# Patient Record
Sex: Female | Born: 1985 | Race: Black or African American | Hispanic: No | Marital: Married | Smoking: Never smoker
Health system: Southern US, Community
[De-identification: ages and names within clinical notes are randomized; demographics above are authoritative.]

## PROBLEM LIST (undated history)

## (undated) DIAGNOSIS — K219 Gastro-esophageal reflux disease without esophagitis: Secondary | ICD-10-CM

## (undated) DIAGNOSIS — F32A Depression, unspecified: Secondary | ICD-10-CM

## (undated) DIAGNOSIS — F419 Anxiety disorder, unspecified: Secondary | ICD-10-CM

## (undated) HISTORY — PX: NO PAST SURGERIES: SHX2092

## (undated) HISTORY — DX: Gastro-esophageal reflux disease without esophagitis: K21.9

## (undated) HISTORY — DX: Depression, unspecified: F32.A

## (undated) HISTORY — DX: Anxiety disorder, unspecified: F41.9

---
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
## 2003-07-26 ENCOUNTER — Other Ambulatory Visit: Admission: RE | Admit: 2003-07-26 | Discharge: 2003-07-26 | Payer: Self-pay | Admitting: Obstetrics and Gynecology

## 2005-02-19 ENCOUNTER — Other Ambulatory Visit: Admission: RE | Admit: 2005-02-19 | Discharge: 2005-02-19 | Payer: Self-pay | Admitting: Obstetrics and Gynecology

## 2005-10-13 ENCOUNTER — Emergency Department (HOSPITAL_COMMUNITY): Admission: EM | Admit: 2005-10-13 | Discharge: 2005-10-13 | Payer: Self-pay | Admitting: *Deleted

## 2005-12-31 ENCOUNTER — Emergency Department (HOSPITAL_COMMUNITY): Admission: EM | Admit: 2005-12-31 | Discharge: 2005-12-31 | Payer: Self-pay | Admitting: Family Medicine

## 2006-02-01 ENCOUNTER — Emergency Department (HOSPITAL_COMMUNITY): Admission: EM | Admit: 2006-02-01 | Discharge: 2006-02-01 | Payer: Self-pay | Admitting: Family Medicine

## 2007-04-01 ENCOUNTER — Emergency Department (HOSPITAL_COMMUNITY): Admission: EM | Admit: 2007-04-01 | Discharge: 2007-04-01 | Payer: Self-pay | Admitting: Family Medicine

## 2007-04-22 ENCOUNTER — Emergency Department (HOSPITAL_COMMUNITY): Admission: EM | Admit: 2007-04-22 | Discharge: 2007-04-22 | Payer: Self-pay | Admitting: Emergency Medicine

## 2008-01-26 ENCOUNTER — Emergency Department (HOSPITAL_COMMUNITY): Admission: EM | Admit: 2008-01-26 | Discharge: 2008-01-26 | Payer: Self-pay | Admitting: Emergency Medicine

## 2009-04-21 ENCOUNTER — Ambulatory Visit (HOSPITAL_COMMUNITY): Admission: RE | Admit: 2009-04-21 | Discharge: 2009-04-21 | Payer: Self-pay | Admitting: Family Medicine

## 2009-06-08 ENCOUNTER — Emergency Department (HOSPITAL_COMMUNITY): Admission: EM | Admit: 2009-06-08 | Discharge: 2009-06-08 | Payer: Self-pay | Admitting: Emergency Medicine

## 2009-06-21 ENCOUNTER — Emergency Department (HOSPITAL_COMMUNITY): Admission: EM | Admit: 2009-06-21 | Discharge: 2009-06-21 | Payer: Self-pay | Admitting: Emergency Medicine

## 2010-10-11 IMAGING — CT CT HEAD W/O CM
1 series · 16 of 30 positions shown, 20 images · non-contrast
Comparison: None

CLINICAL DATA: Headache

CT HEAD WITHOUT CONTRAST
TECHNIQUE: Contiguous axial images were obtained from the base of
the skull through the vertex without contrast.

[Series 2: brain · axial · 0.47mm/px · z∈[+89,+224]mm · 16 of 30 slices shown, 20 images]
[im 2/30  brain]
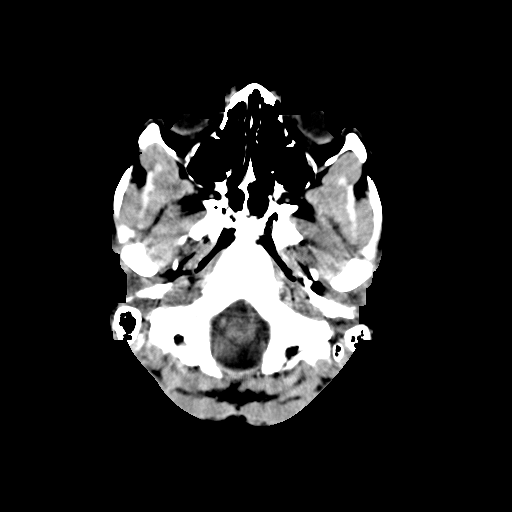
[im 2/30  bone]
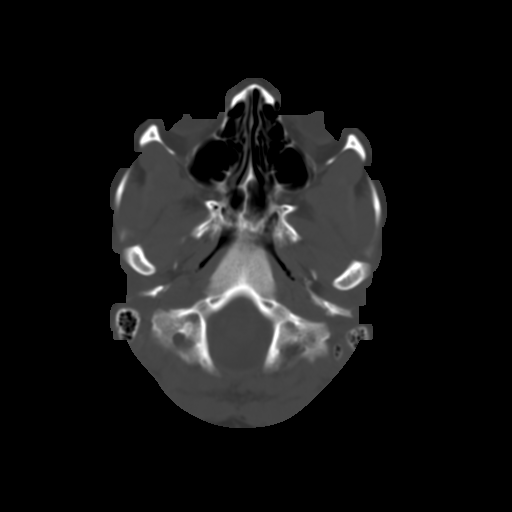
[im 4/30  brain]
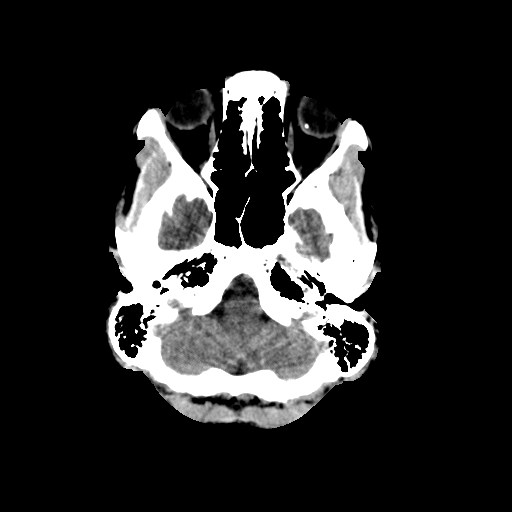
[im 6/30  brain]
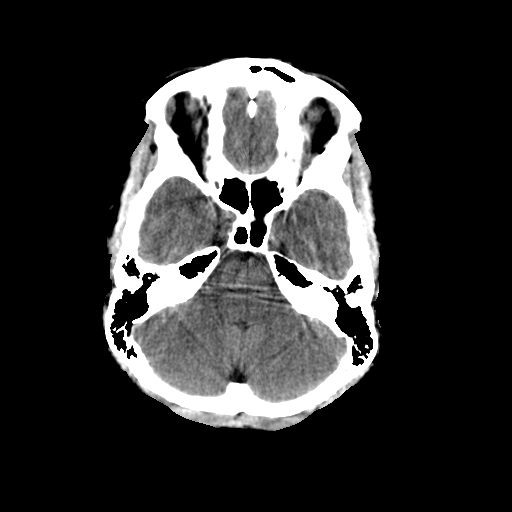
[im 8/30  brain]
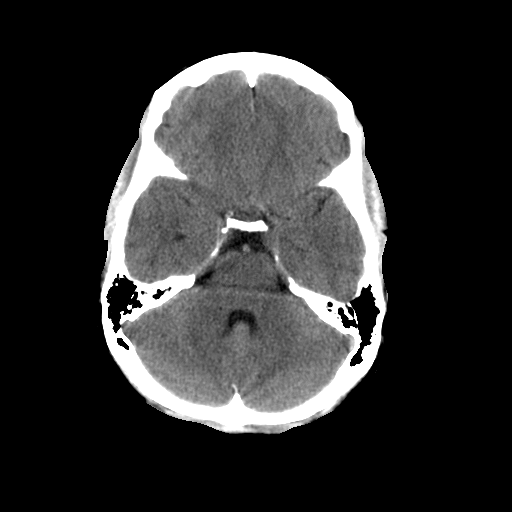
[im 9/30  brain]
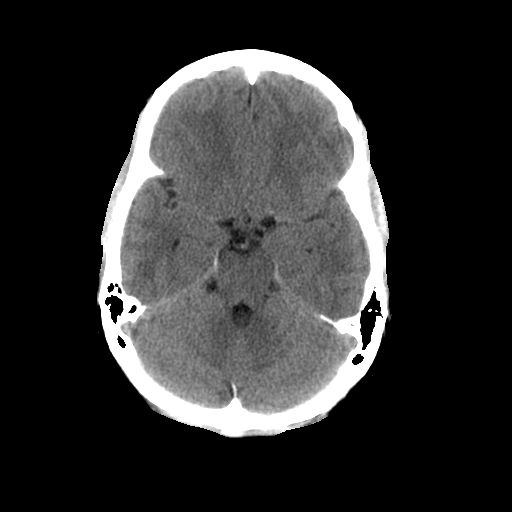
[im 9/30  bone]
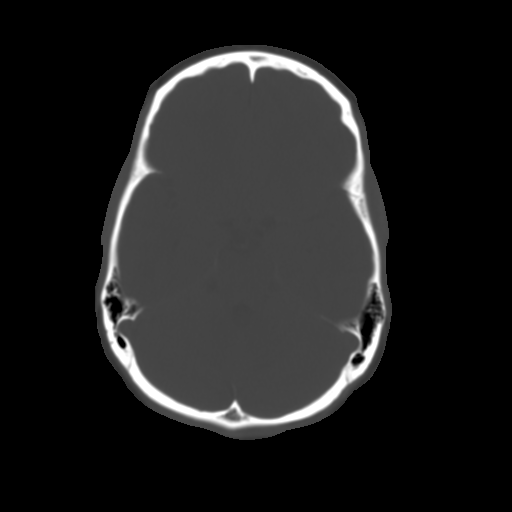
[im 11/30  brain]
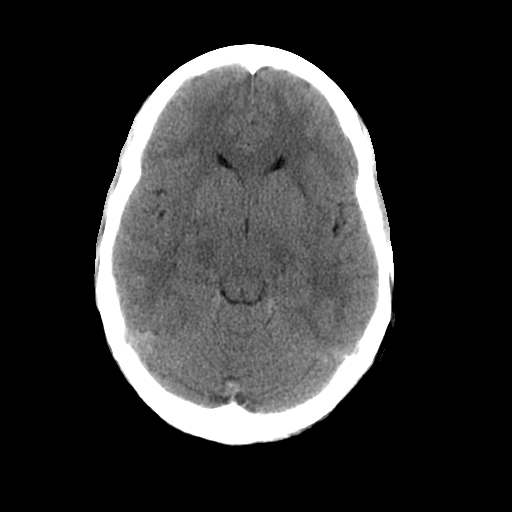
[im 13/30  brain]
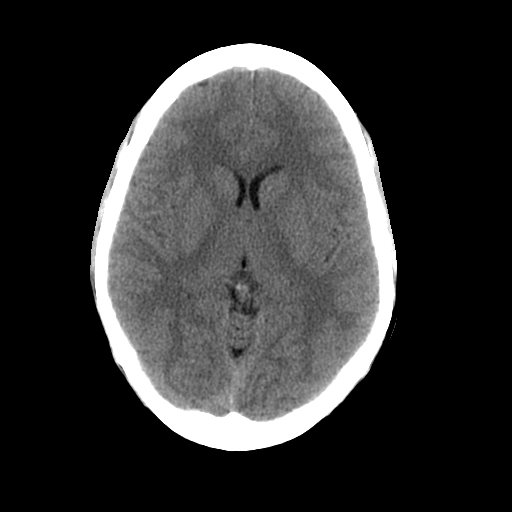
[im 15/30  brain]
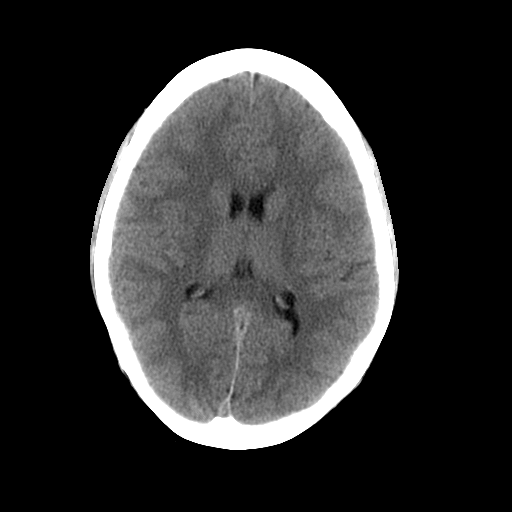
[im 16/30  brain]
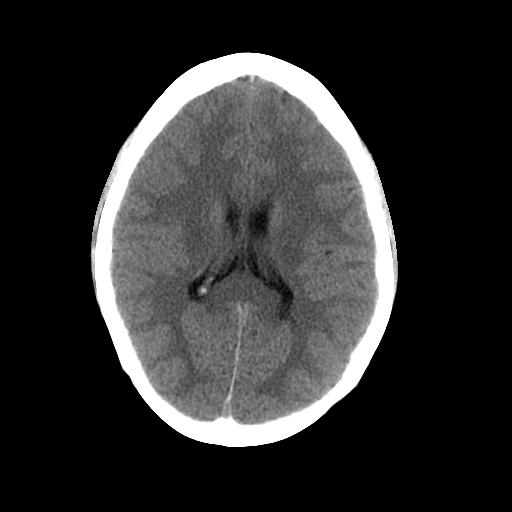
[im 16/30  bone]
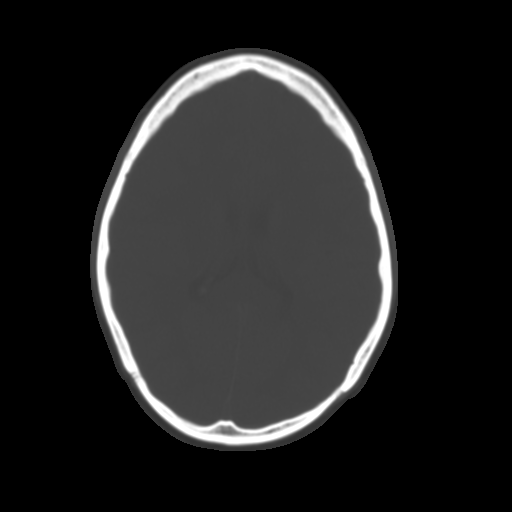
[im 18/30  brain]
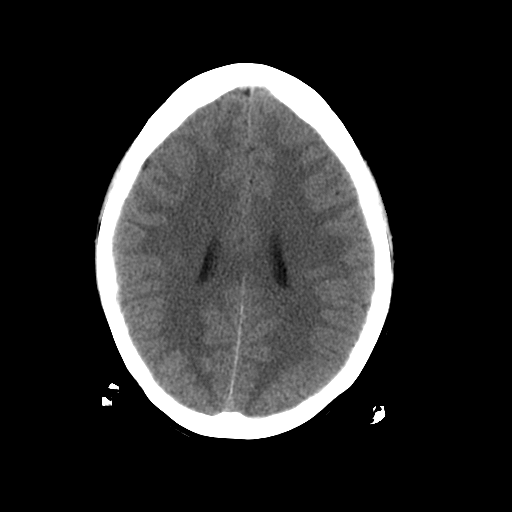
[im 20/30  brain]
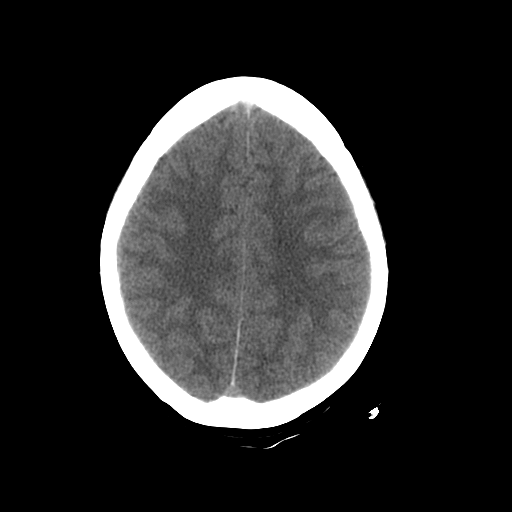
[im 22/30  brain]
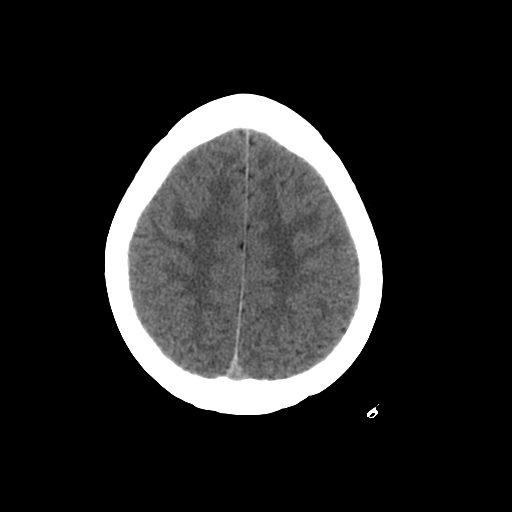
[im 23/30  brain]
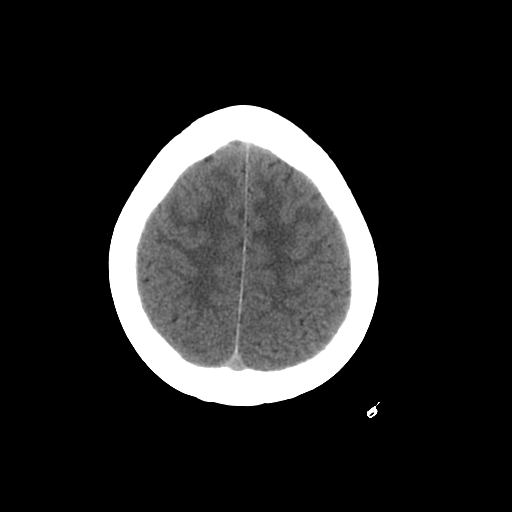
[im 23/30  bone]
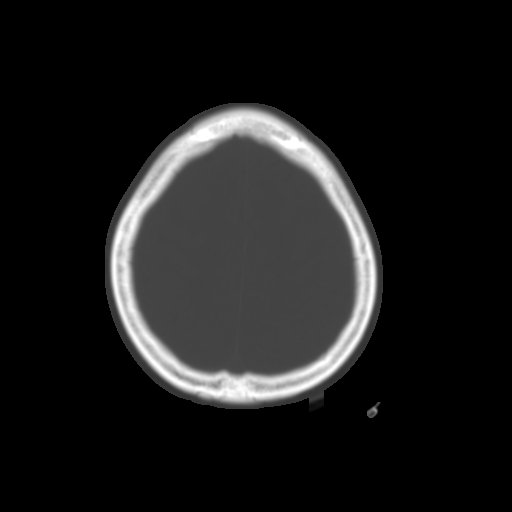
[im 25/30  brain]
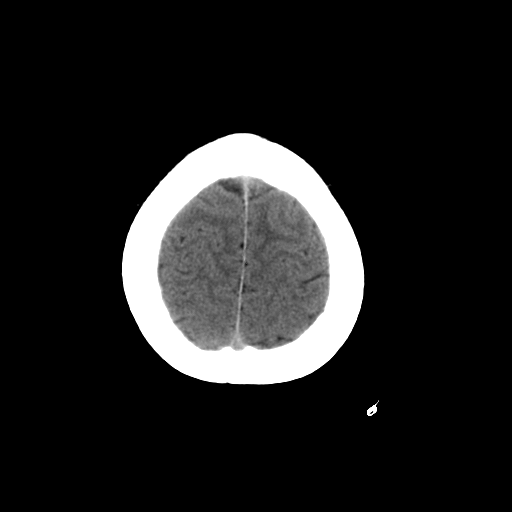
[im 27/30  brain]
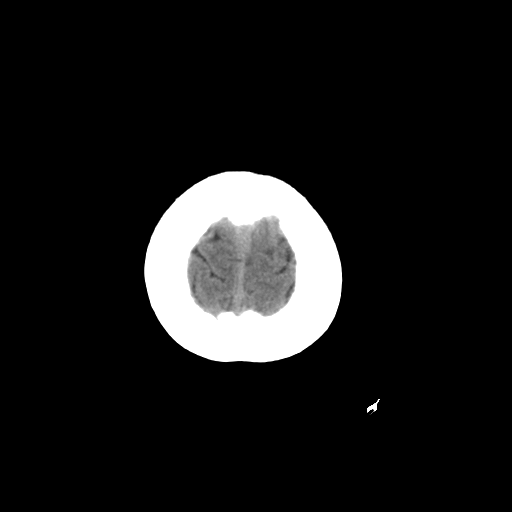
[im 29/30  brain]
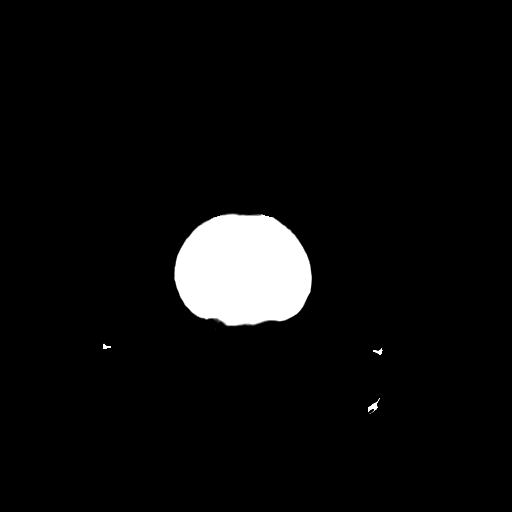

[16 of 30 positions shown; findings below may reference images not displayed]

FINDINGS: No mass effect, midline shift, or acute intracranial
hemorrhage.  Ventricles system is unremarkable.  Mastoid air cells
are clear.  Cranium is intact.
IMPRESSION: No acute intracranial pathology.

## 2010-12-11 IMAGING — CR DG CHEST 2V
2 series · 2 of 2 positions shown · non-contrast
Comparison: 01/26/2008

CLINICAL DATA: Chest pain radiating to back.

CHEST - 2 VIEW

[w chest pa]
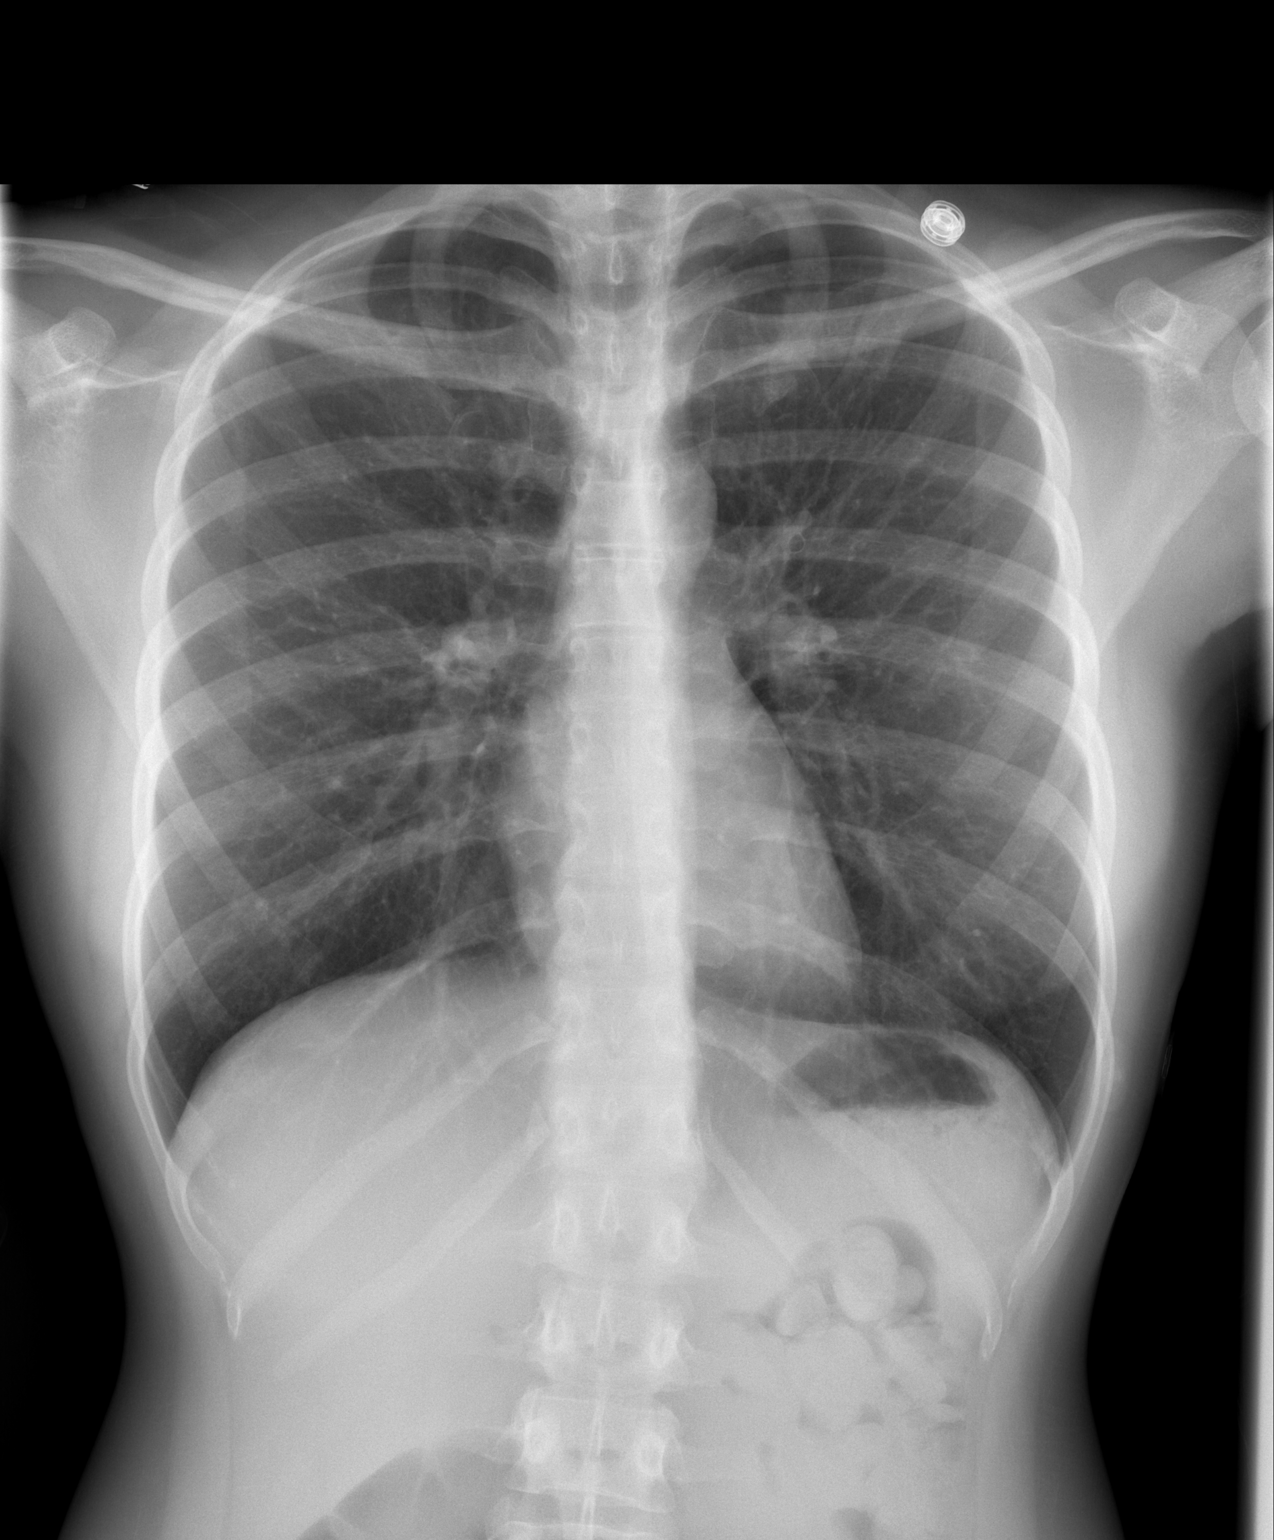

[w chest lat]
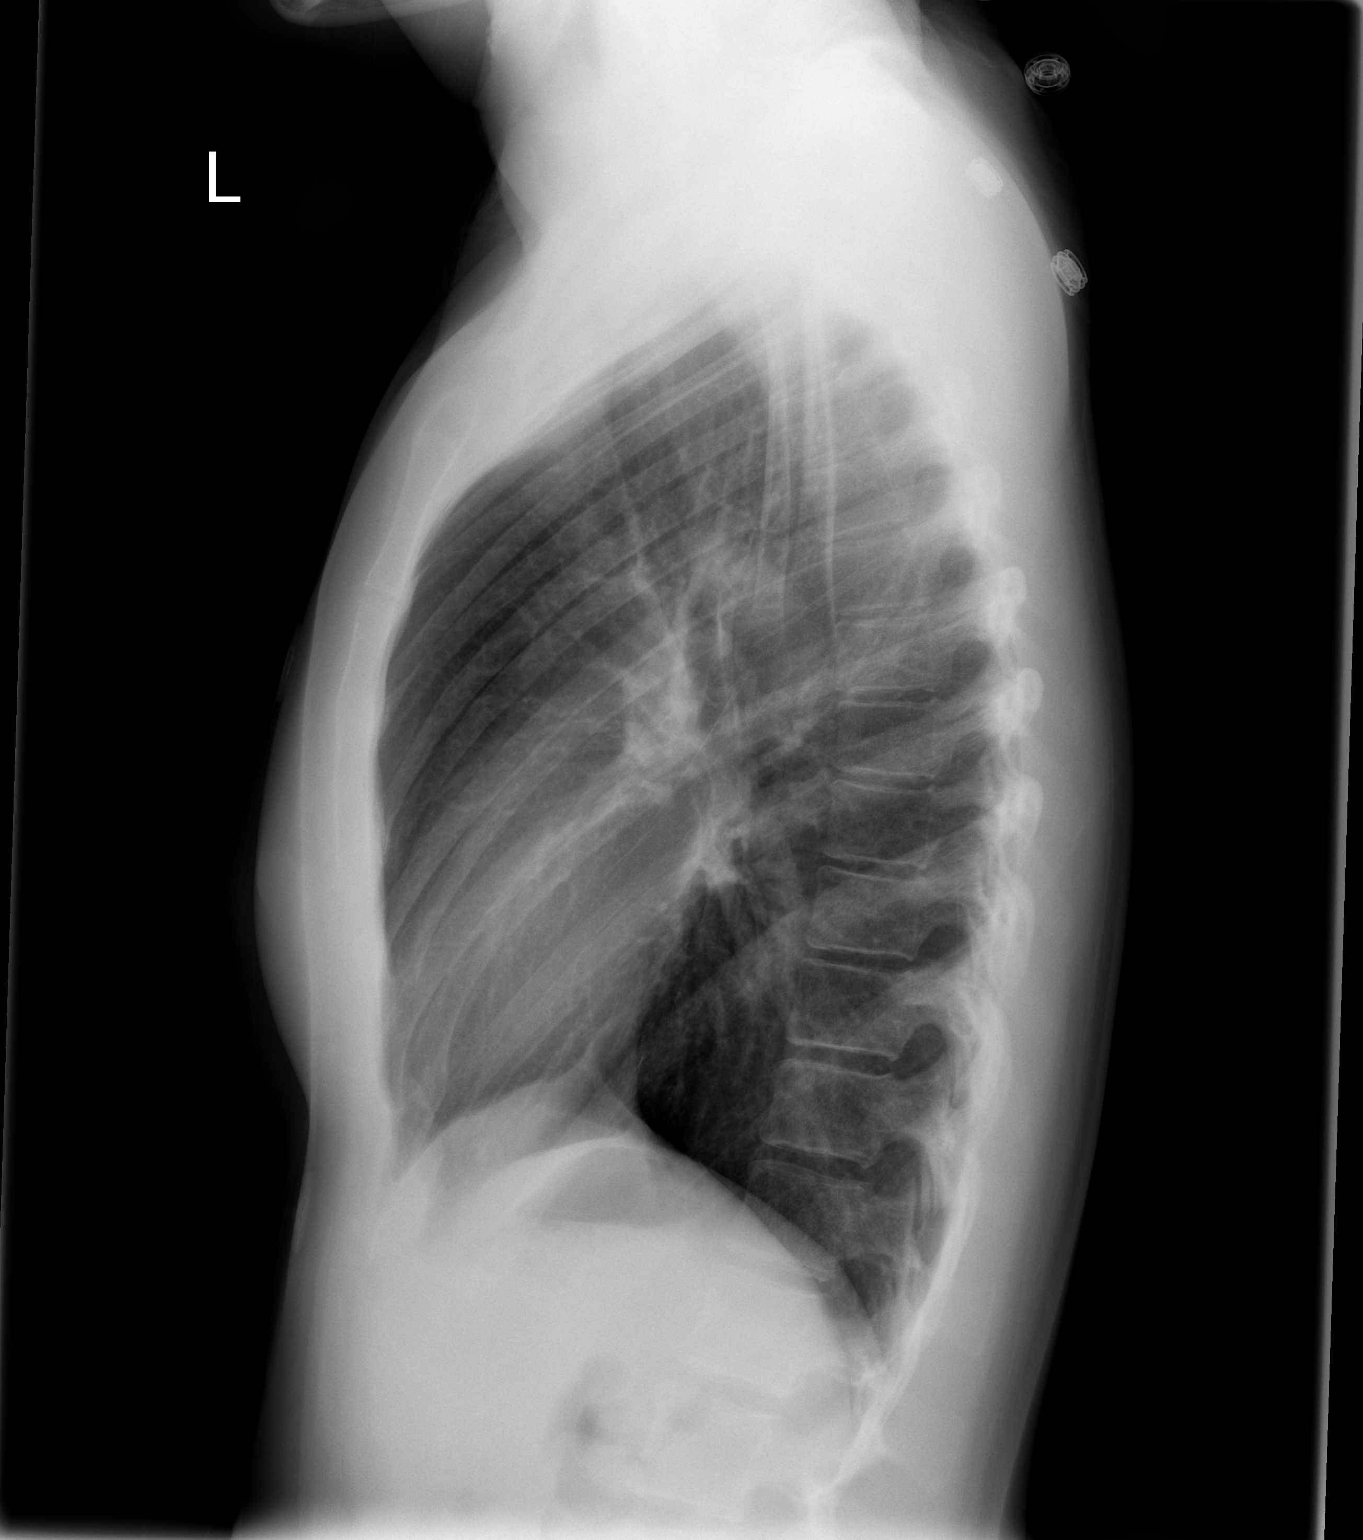

[2 of 2 positions shown; findings below may reference images not displayed]

FINDINGS: The heart size and mediastinal contours are within
normal limits.  Both lungs are clear.  The visualized skeletal
structures are unremarkable.
IMPRESSION: No active cardiopulmonary disease.

## 2011-03-15 LAB — POCT I-STAT, CHEM 8
BUN: 12 mg/dL (ref 6–23)
Chloride: 104 mEq/L (ref 96–112)
Creatinine, Ser: 0.9 mg/dL (ref 0.4–1.2)
Glucose, Bld: 99 mg/dL (ref 70–99)
Potassium: 4.1 mEq/L (ref 3.5–5.1)

## 2011-10-21 ENCOUNTER — Ambulatory Visit (INDEPENDENT_AMBULATORY_CARE_PROVIDER_SITE_OTHER): Payer: Self-pay | Admitting: Internal Medicine

## 2011-10-21 ENCOUNTER — Encounter: Payer: Self-pay | Admitting: Internal Medicine

## 2011-10-21 VITALS — BP 106/64 | HR 77 | Temp 97.9°F | Resp 16 | Wt 131.0 lb

## 2011-10-21 DIAGNOSIS — S139XXA Sprain of joints and ligaments of unspecified parts of neck, initial encounter: Secondary | ICD-10-CM

## 2011-10-21 DIAGNOSIS — Z23 Encounter for immunization: Secondary | ICD-10-CM

## 2011-10-21 DIAGNOSIS — S161XXA Strain of muscle, fascia and tendon at neck level, initial encounter: Secondary | ICD-10-CM

## 2011-10-21 NOTE — Patient Instructions (Signed)
Cervical Strain Care After A cervical strain is when the muscles and ligaments in your neck have been stretched. The bones are not broken. If you had any problems moving your arms or legs immediately after the injury, even if the problem has gone away, make sure to tell this to your caregiver.  HOME CARE INSTRUCTIONS   While awake, apply ice packs to the neck or areas of pain about every 1 to 2 hours, for 15 to 20 minutes at a time. Do this for 2 days. If you were given a cervical collar for support, ask your caregiver if you may remove it for bathing or applying ice.   If given a cervical collar, wear as instructed. Do not remove any collar unless instructed by a caregiver.   Only take over-the-counter or prescription medicines for pain, discomfort, or fever as directed by your caregiver.  Recheck with the hospital or clinic after a radiologist has read your X-rays. Recheck with the hospital or clinic to make sure the initial readings are correct. Do this also to determine if you need further studies. It is your responsibility to find out your X-ray results. X-rays are sometimes repeated in one week to ten days. These are often repeated to make sure that a hairline fracture was not overlooked. Ask your caregiver how you are to find out about your radiology (X-ray) results. SEEK IMMEDIATE MEDICAL CARE IF:   You have increasing pain in your neck.   You develop difficulties swallowing or breathing.   You have numbness, weakness, or movement problems in the arms or legs.   You have difficulty walking.   You develop bowel or bladder retention or incontinence.   You have problems with walking.  MAKE SURE YOU:   Understand these instructions.   Will watch your condition.   Will get help right away if you are not doing well or get worse.  Document Released: 11/23/2005 Document Revised: 08/05/2011 Document Reviewed: 07/06/2008 Exit[employer] Patient Information 2012 [employer]. 

## 2011-10-22 NOTE — Assessment & Plan Note (Signed)
She does not have any radicular symptoms and has a normal exam. Also, there is not hx. Of trauma and no midline s/s so I do not think an xray is needed. She will continue with aleve and I have asked her to start PT to help with her nocturnal positioning and exercise modalities.

## 2011-10-22 NOTE — Progress Notes (Signed)
Subjective:    Patient ID: Monique Pierce, female    DOB: 02-18-1986, 25 y.o.   MRN: [id number]  HPI New to me she complains of stiffness in her neck for 3 months. She has not had an injury but she tells me that she sleeps in awkward positions with her neck in the extremes of flexion or extension. When she awakens in the morning she will have stiffness in both traps and she will take aleve and the pain will resolve. The pain does not radiate into her shoulders or arms and she does not have any numbness, weakness, or tingling in her arms or legs.   Review of Systems  Constitutional: Negative.   HENT: Positive for neck pain and neck stiffness. Negative for hearing loss, nosebleeds, sore throat, drooling, trouble swallowing, voice change, sinus pressure and ear discharge.   Eyes: Negative.   Respiratory: Negative.   Cardiovascular: Negative.   Gastrointestinal: Negative.   Genitourinary: Negative.   Musculoskeletal: Negative for myalgias, back pain, joint swelling, arthralgias and gait problem.  Skin: Negative for color change, pallor, rash and wound.  Neurological: Negative for dizziness, tremors, seizures, syncope, facial asymmetry, speech difficulty, weakness, light-headedness, numbness and headaches.  Hematological: Negative for adenopathy. Does not bruise/bleed easily.  Psychiatric/Behavioral: Negative.        Objective:   Physical Exam  Vitals reviewed. Constitutional: She is oriented to person, place, and time. She appears well-developed and well-nourished. No distress.  HENT:  Head: Normocephalic and atraumatic.  Mouth/Throat: Oropharynx is clear and moist. No oropharyngeal exudate.  Eyes: Conjunctivae are normal. Right eye exhibits no discharge. Left eye exhibits no discharge. No scleral icterus.  Neck: Normal range of motion. Neck supple. No JVD present. No tracheal deviation present. No thyromegaly present.  Cardiovascular: Normal rate, regular rhythm, normal heart sounds and  intact distal pulses.  Exam reveals no gallop and no friction rub.   No murmur heard. Pulmonary/Chest: Effort normal and breath sounds normal. No stridor. No respiratory distress. She has no wheezes. She has no rales. She exhibits no tenderness.  Abdominal: Soft. Bowel sounds are normal. She exhibits no distension and no mass. There is no tenderness. There is no rebound and no guarding.  Musculoskeletal: Normal range of motion. She exhibits no edema and no tenderness.       Cervical back: Normal. She exhibits normal range of motion, no tenderness, no bony tenderness, no swelling, no edema, no deformity, no laceration, no pain, no spasm and normal pulse.  Lymphadenopathy:    She has no cervical adenopathy.  Neurological: She is alert and oriented to person, place, and time. She has normal strength. She displays no atrophy, no tremor and normal reflexes. No cranial nerve deficit or sensory deficit. She exhibits normal muscle tone. She displays a negative Romberg sign. She displays no seizure activity. Coordination and gait normal. She displays no Babinski's sign on the right side. She displays no Babinski's sign on the left side.  Reflex Scores:      Tricep reflexes are 1+ on the right side and 1+ on the left side.      Bicep reflexes are 1+ on the right side and 1+ on the left side.      Brachioradialis reflexes are 1+ on the right side and 1+ on the left side.      Patellar reflexes are 1+ on the right side and 1+ on the left side.      Achilles reflexes are 1+ on the right side and  1+ on the left side. Skin: Skin is warm and dry. No rash noted. She is not diaphoretic. No erythema. No pallor.  Psychiatric: She has a normal mood and affect. Her behavior is normal. Judgment and thought content normal.          Assessment & Plan:

## 2011-10-26 ENCOUNTER — Telehealth: Payer: Self-pay

## 2011-10-26 DIAGNOSIS — S161XXA Strain of muscle, fascia and tendon at neck level, initial encounter: Secondary | ICD-10-CM

## 2011-10-26 MED ORDER — METHOCARBAMOL 500 MG PO TABS: 500.0000 mg | ORAL_TABLET | Freq: Four times a day (QID) | ORAL | Status: AC

## 2011-10-26 MED ORDER — METHOCARBAMOL 500 MG PO TABS: 500.0000 mg | ORAL_TABLET | Freq: Four times a day (QID) | ORAL | Status: DC

## 2011-10-26 NOTE — Telephone Encounter (Signed)
Addended by: Rock Nephew T on: 10/26/2011 03:20 PM   Modules accepted: Orders

## 2011-10-26 NOTE — Telephone Encounter (Signed)
Pharmacy given by patient and rx sent in

## 2011-10-26 NOTE — Telephone Encounter (Signed)
Pharmacy is needed 

## 2011-10-26 NOTE — Telephone Encounter (Signed)
Returned call to patient/[facility] requesting pharmacy

## 2011-10-26 NOTE — Telephone Encounter (Signed)
Patient called [facility] stating that she was seen last week by MD and PT was ordered for her. She would like to know if MD would send in rx for a muscle relaxer to help with her increased neck pain until she is seen. Please advise Thanks

## 2011-11-11 ENCOUNTER — Ambulatory Visit: Payer: BC Managed Care – PPO | Attending: Internal Medicine | Admitting: Rehabilitation

## 2011-11-11 DIAGNOSIS — R293 Abnormal posture: Secondary | ICD-10-CM | POA: Insufficient documentation

## 2011-11-11 DIAGNOSIS — M542 Cervicalgia: Secondary | ICD-10-CM | POA: Insufficient documentation

## 2011-11-11 DIAGNOSIS — IMO0001 Reserved for inherently not codable concepts without codable children: Secondary | ICD-10-CM | POA: Insufficient documentation

## 2011-11-23 ENCOUNTER — Ambulatory Visit: Payer: BC Managed Care – PPO | Admitting: Physical Therapy

## 2011-11-25 ENCOUNTER — Encounter: Payer: BC Managed Care – PPO | Admitting: Physical Therapy

## 2011-11-25 ENCOUNTER — Ambulatory Visit: Payer: BC Managed Care – PPO | Admitting: Rehabilitation

## 2011-12-10 ENCOUNTER — Encounter: Payer: BC Managed Care – PPO | Admitting: Rehabilitation

## 2011-12-14 ENCOUNTER — Encounter: Payer: BC Managed Care – PPO | Admitting: Physical Therapy

## 2011-12-17 ENCOUNTER — Encounter: Payer: BC Managed Care – PPO | Admitting: Physical Therapy

## 2011-12-21 ENCOUNTER — Encounter: Payer: BC Managed Care – PPO | Admitting: Physical Therapy

## 2011-12-24 ENCOUNTER — Encounter: Payer: BC Managed Care – PPO | Admitting: Physical Therapy

## 2012-04-28 ENCOUNTER — Telehealth: Payer: Self-pay | Admitting: Internal Medicine

## 2012-04-28 NOTE — Telephone Encounter (Signed)
I contacted pt to sched an appt per lakisha and pt says:  " That's ok , I am not coming in---

## 2012-04-28 NOTE — Telephone Encounter (Signed)
Pt requesting prescription/voltaren gel sent to [facility] [address]--pt desk ph# [phone]

## 2012-04-28 NOTE — Telephone Encounter (Signed)
i think this is the wrong chart

## 2012-04-28 NOTE — Telephone Encounter (Signed)
Monique Pierce 04/28/2012 10:58 AM Signed  I contacted pt to sched an appt per Alvy Beal and pt says: " That's ok , I am not coming in---

## 2012-04-28 NOTE — Telephone Encounter (Signed)
The pt called and is hoping to get a refill of zoltaren [facility] to [facility] [address].   [phone]

## 2012-07-01 ENCOUNTER — Ambulatory Visit (INDEPENDENT_AMBULATORY_CARE_PROVIDER_SITE_OTHER): Payer: BC Managed Care – PPO | Admitting: Internal Medicine

## 2012-07-01 ENCOUNTER — Encounter: Payer: Self-pay | Admitting: Internal Medicine

## 2012-07-01 VITALS — BP 106/62 | HR 67 | Temp 98.4°F | Resp 98 | Wt 132.0 lb

## 2012-07-01 DIAGNOSIS — R1310 Dysphagia, unspecified: Secondary | ICD-10-CM

## 2012-07-01 DIAGNOSIS — K219 Gastro-esophageal reflux disease without esophagitis: Secondary | ICD-10-CM

## 2012-07-01 MED ORDER — RANITIDINE HCL 300 MG PO TABS: 300.0000 mg | ORAL_TABLET | Freq: Every day | ORAL | Status: DC

## 2012-07-01 NOTE — Assessment & Plan Note (Signed)
She has been taking otc zantac 75 mg per day with some relief, I increased that to 300 mg per day

## 2012-07-01 NOTE — Assessment & Plan Note (Signed)
GI referral to consider UGI endoscopy

## 2012-07-01 NOTE — Progress Notes (Signed)
  Subjective:    Patient ID: Monique Pierce, female    DOB: 1986-01-25, 26 y.o.   MRN: [id number]  Gastrophageal Reflux She complains of dysphagia (it hurts to swallow food) and heartburn. She reports no abdominal pain, no belching, no chest pain, no choking, no coughing, no early satiety, no globus sensation, no hoarse voice, no nausea, no sore throat, no stridor, no tooth decay, no water brash or no wheezing. This is a chronic problem. The current episode started more than 1 year ago. The problem occurs frequently. The problem has been gradually worsening. The heartburn duration is several minutes. The heartburn is located in the substernum. The heartburn is of mild intensity. The heartburn wakes her from sleep. The heartburn does not limit her activity. The heartburn doesn't change with position. Nothing aggravates the symptoms. Pertinent negatives include no anemia, fatigue, melena, muscle weakness, orthopnea or weight loss. Risk factors include no known risk factors. She has tried a histamine-2 antagonist for the symptoms. The treatment provided moderate relief.      Review of Systems  Constitutional: Negative.  Negative for weight loss and fatigue.  HENT: Negative.  Negative for sore throat and hoarse voice.   Eyes: Negative.   Respiratory: Negative.  Negative for cough, choking, chest tightness, shortness of breath, wheezing and stridor.   Cardiovascular: Negative.  Negative for chest pain.  Gastrointestinal: Positive for heartburn and dysphagia (it hurts to swallow food). Negative for nausea, vomiting, abdominal pain, diarrhea, constipation, blood in stool, melena, abdominal distention, anal bleeding and rectal pain.  Genitourinary: Negative.   Musculoskeletal: Negative.  Negative for muscle weakness.  Skin: Negative.   Neurological: Negative.   Hematological: Negative.   Psychiatric/Behavioral: Negative.        Objective:   Physical Exam  Vitals reviewed. Constitutional: She is  oriented to person, place, and time. She appears well-developed and well-nourished. No distress.  HENT:  Head: Normocephalic and atraumatic.  Mouth/Throat: Oropharynx is clear and moist. No oropharyngeal exudate.  Eyes: Conjunctivae are normal. Right eye exhibits no discharge. Left eye exhibits no discharge. No scleral icterus.  Neck: Normal range of motion. Neck supple. No JVD present. No tracheal deviation present. No thyromegaly present.  Cardiovascular: Normal rate, regular rhythm, normal heart sounds and intact distal pulses.  Exam reveals no gallop and no friction rub.   No murmur heard. Pulmonary/Chest: Effort normal and breath sounds normal. No stridor. No respiratory distress. She has no wheezes. She has no rales. She exhibits no tenderness.  Abdominal: Soft. Bowel sounds are normal. She exhibits no distension and no mass. There is no tenderness. There is no rebound and no guarding.  Musculoskeletal: Normal range of motion. She exhibits no edema and no tenderness.  Lymphadenopathy:    She has no cervical adenopathy.  Neurological: She is oriented to person, place, and time.  Skin: Skin is warm and dry. No rash noted. She is not diaphoretic. No erythema. No pallor.  Psychiatric: She has a normal mood and affect. Her behavior is normal. Judgment and thought content normal.      Lab Results  Component Value Date   HGB 14.3 06/21/2009   HCT 42.0 06/21/2009   GLUCOSE 99 06/21/2009   NA 140 06/21/2009   K 4.1 06/21/2009   CL 104 06/21/2009   CREATININE 0.9 06/21/2009   BUN 12 06/21/2009      Assessment & Plan:

## 2012-07-01 NOTE — Patient Instructions (Signed)

## 2012-08-31 ENCOUNTER — Ambulatory Visit: Payer: BC Managed Care – PPO | Admitting: Internal Medicine

## 2012-09-12 ENCOUNTER — Encounter: Payer: Self-pay | Admitting: Internal Medicine

## 2012-09-21 ENCOUNTER — Encounter: Payer: Self-pay | Admitting: Internal Medicine

## 2012-09-30 ENCOUNTER — Encounter: Payer: Self-pay | Admitting: Internal Medicine

## 2012-10-03 ENCOUNTER — Ambulatory Visit (INDEPENDENT_AMBULATORY_CARE_PROVIDER_SITE_OTHER): Payer: BC Managed Care – PPO | Admitting: Internal Medicine

## 2012-10-03 ENCOUNTER — Encounter: Payer: Self-pay | Admitting: Internal Medicine

## 2012-10-03 VITALS — BP 100/60 | HR 60 | Ht 65.0 in | Wt 132.0 lb

## 2012-10-03 DIAGNOSIS — R1314 Dysphagia, pharyngoesophageal phase: Secondary | ICD-10-CM

## 2012-10-03 DIAGNOSIS — R1310 Dysphagia, unspecified: Secondary | ICD-10-CM

## 2012-10-03 DIAGNOSIS — K219 Gastro-esophageal reflux disease without esophagitis: Secondary | ICD-10-CM

## 2012-10-03 DIAGNOSIS — R1319 Other dysphagia: Secondary | ICD-10-CM

## 2012-10-03 DIAGNOSIS — R141 Gas pain: Secondary | ICD-10-CM

## 2012-10-03 DIAGNOSIS — R140 Abdominal distension (gaseous): Secondary | ICD-10-CM

## 2012-10-03 MED ORDER — PANTOPRAZOLE SODIUM 40 MG PO TBEC: 40.0000 mg | DELAYED_RELEASE_TABLET | Freq: Every day | ORAL | Status: DC

## 2012-10-03 MED ORDER — ALIGN PO CAPS: 1.0000 | ORAL_CAPSULE | Freq: Every day | ORAL | Status: DC

## 2012-10-03 NOTE — Progress Notes (Signed)
Patient ID: Monique Pierce, female   DOB: 1986-01-29, 26 y.o.   MRN: [id number]  SUBJECTIVE: HPI [name] is a 26 year old female with past medical history of heartburn who seen in consultation at the request of Dr. Yetta Pierce for evaluation of heartburn and mild dysphagia. The patient reports issues with heartburn for years. This has worsened recently, and she's been using ranitidine 300 mg at bedtime. This does help her nocturnal symptoms, but she does have heartburn during the day as well. This is followed by the and occurring on a daily basis. It is not severe everyday however. She does report intermittent solid food dysphagia, but does not have trouble with liquids. She has not lost weight. Her appetite has remained good. No nausea or vomiting. No weight loss. Occasional cough but no hoarseness. Bowel habits have been normal without blood or melena. She does report occasional epigastric abdominal pain, also after eating, but this is not frequent. No fevers or chills. She does report issues on occasion with gas, both belching and lower gas and mild bloating.  Review of Systems  As per history of present illness, otherwise negative   Past Medical History  Diagnosis Date  . GERD (gastroesophageal reflux disease)     Current Outpatient Prescriptions  Medication Sig Dispense Refill  . bifidobacterium infantis (ALIGN) capsule Take 1 capsule by mouth daily.  14 capsule  0  . pantoprazole (PROTONIX) 40 MG tablet Take 1 tablet (40 mg total) by mouth daily.  30 tablet  11    No Known Allergies  Family History  Problem Relation Age of Onset  . Cancer Neg Hx   . Heart disease Neg Hx   . Breast cancer Maternal Grandmother     History  Substance Use Topics  . Smoking status: Never Smoker   . Smokeless tobacco: Never Used  . Alcohol Use: No    OBJECTIVE: BP 100/60  Pulse 60  Ht 5\' 5"  (1.651 m)  Wt 132 lb (59.875 kg)  BMI 21.97 kg/m2 Constitutional: Well-developed and well-nourished. No  distress. HEENT: Normocephalic and atraumatic. Oropharynx is clear and moist. No oropharyngeal exudate. Conjunctivae are normal. No scleral icterus. Neck: Neck supple. Trachea midline. Cardiovascular: Normal rate, regular rhythm and intact distal pulses. No M/R/G Pulmonary/chest: Effort normal and breath sounds normal. No wheezing, rales or rhonchi. Abdominal: Soft, nontender, nondistended. Bowel sounds active throughout. There are no masses palpable. No hepatosplenomegaly. Extremities: no clubbing, cyanosis, or edema Lymphadenopathy: No cervical adenopathy noted. Neurological: Alert and oriented to person place and time. Skin: Skin is warm and dry. No rashes noted. Psychiatric: Normal mood and affect. Behavior is normal.   ASSESSMENT AND PLAN: 26 year old female with past medical history of heartburn who seen in consultation at the request of Dr. Yetta Pierce for evaluation of heartburn and mild dysphagia.  1.  GERD/esophageal dysphagia/bloating --the patient's symptoms are fairly typical for GERD.  I do think she needs a daily PPI at this point given her ongoing symptoms with once daily H2 blocker. We discussed the medication and I will prescribe pantoprazole 40 mg daily. I've advised her to take this 30 minutes to one hour before her first melena day. She can, if necessary, use Zantac for breakthrough symptoms. I also recommended upper endoscopy given her dysphagia, and she is agreeable to proceed. She has requested that this be performed in January, due to insurance reasons. I think it is okay for this exam performed in January 2014 assuming that her symptoms do not worsen in the interim.  She is aware of this recommendation, and I'll see her back in January to reassess her symptoms and to discuss endoscopy.  I recommended a trial of Align one capsule daily for gas and bloating. We can reassess this symptom at followup

## 2012-10-03 NOTE — Patient Instructions (Signed)
You have been given a separate informational sheet regarding your tobacco use, the importance of quitting and local resources to help you quit.  We have given you samples of Align. This puts good bacteria back into your colon. You should take 1 capsule by mouth once daily. If this works well for you, it can be purchased over the counter.  We have sent the following medications to your pharmacy for you to pick up at your convenience: Protonix.  Please discontinue taking Zantac unless you have breakthrough symptoms of acid reflux while taking Protonix.  Please follow up with Dr. Rhea Belton in January.

## 2013-02-13 ENCOUNTER — Telehealth: Payer: Self-pay | Admitting: Internal Medicine

## 2013-02-13 NOTE — Telephone Encounter (Signed)
Called pt back at number we have for her [phone]; number has been disconnected.

## 2013-02-14 ENCOUNTER — Telehealth: Payer: Self-pay | Admitting: Gastroenterology

## 2013-02-14 NOTE — Telephone Encounter (Signed)
Pt called in saying she went to her pharmacy to refill her medication (she wasn't sure which one) but it started with a "P" and her pharmacy said her Dr. Stann Mainland need to call her insurance co. I told her I will call them, it sounds like she is needing a prior auth for her medication and I will call her back once I talk to them.

## 2013-02-16 ENCOUNTER — Telehealth: Payer: Self-pay | Admitting: *Deleted

## 2013-02-16 ENCOUNTER — Telehealth: Payer: Self-pay | Admitting: Internal Medicine

## 2013-02-16 NOTE — Telephone Encounter (Signed)
Patient was left a message that Kennyth Arnold is out of the office until Monday and has not received a response for prior authorization.

## 2013-02-16 NOTE — Telephone Encounter (Signed)
Spoke with Mike Gip about this situation and she gave the ok to substitute Nexium 40mg  #10 to last until the prior authorization is completed. Patient to pick up samples: Lot #[id number] Exp. 06/2015

## 2013-02-23 ENCOUNTER — Telehealth: Payer: Self-pay | Admitting: Gastroenterology

## 2013-02-23 NOTE — Telephone Encounter (Signed)
Faxed in Prior auth request form [phone]

## 2013-02-28 ENCOUNTER — Telehealth: Payer: Self-pay | Admitting: Gastroenterology

## 2013-02-28 NOTE — Telephone Encounter (Signed)
Pt has no address or phone # in epic to contact her to let her know the progress of her Prior auth for nexium.

## 2013-03-01 ENCOUNTER — Telehealth: Payer: Self-pay | Admitting: *Deleted

## 2013-03-01 ENCOUNTER — Other Ambulatory Visit: Payer: Self-pay | Admitting: Gastroenterology

## 2013-03-01 ENCOUNTER — Encounter: Payer: Self-pay | Admitting: Internal Medicine

## 2013-03-01 ENCOUNTER — Telehealth: Payer: Self-pay | Admitting: Gastroenterology

## 2013-03-01 DIAGNOSIS — R140 Abdominal distension (gaseous): Secondary | ICD-10-CM

## 2013-03-01 DIAGNOSIS — R1319 Other dysphagia: Secondary | ICD-10-CM

## 2013-03-01 DIAGNOSIS — K219 Gastro-esophageal reflux disease without esophagitis: Secondary | ICD-10-CM

## 2013-03-01 DIAGNOSIS — R1310 Dysphagia, unspecified: Secondary | ICD-10-CM

## 2013-03-01 MED ORDER — PANTOPRAZOLE SODIUM 40 MG PO TBEC: 40.0000 mg | DELAYED_RELEASE_TABLET | Freq: Every day | ORAL | Status: DC

## 2013-03-01 NOTE — Telephone Encounter (Signed)
I have mailed endoscopy paperwork to the patient and will contact her next week to go over instructions verbally with her once she has the instructions in front of her. Her endoscopy is scheduled with Dr Rhea Belton on 03/28/13 @ 10:00 am.

## 2013-03-01 NOTE — Telephone Encounter (Signed)
Called [employer] to check status of Prior auth for Pantoprazole; because of Pt's type of [employer] Medicare prior auth needs to be handled through [facility]. Talked to Jacki Cones at [employer] prior Berkley Harvey has been approved for 1 year from today's date. Rx needs to be written as a 90 day supply, Pt will be notified by Clarnce Flock and a letter from [employer] will be sent out to the patient as well. I will sent Rx to pt's pharmacy/

## 2013-03-07 ENCOUNTER — Telehealth: Payer: Self-pay | Admitting: *Deleted

## 2013-03-07 NOTE — Telephone Encounter (Signed)
I have called patient so that we may go over endoscopy paperwork which was sent to her last week. Patient states that she has not yet gotten a chance to look over the paperwork and would rather me call back Wednesday, 03/08/13 @ 10:00 am which is her break time. I have advised that I will call her back tomorrow.

## 2013-03-08 ENCOUNTER — Telehealth: Payer: Self-pay | Admitting: *Deleted

## 2013-03-08 NOTE — Telephone Encounter (Signed)
I have called and spoken to patient. I have explained in detail the time, date, location and prep for endoscopy scheduled for 03/28/13. Patient also has paperwork detailing all this information. She denies any questions at this time. Patient has also been asked to return signed paperwork to our office stating that she received instructions and that she is not allergic to egg or soy to her knowledge. She states that she will do this.

## 2013-03-21 ENCOUNTER — Telehealth: Payer: Self-pay | Admitting: *Deleted

## 2013-03-21 NOTE — Telephone Encounter (Signed)
Patient called back. She states that she did fax the patient acknowledgement back to our office, however, she is unsure as to which number she faxed it to. I have asked that she refax the form to [phone] and I will call her as soon as I receive the paper. She states that she will do this tomorrow as she does not have her paperwork with her at this time.

## 2013-03-21 NOTE — Telephone Encounter (Signed)
Message copied by Richardson Chiquito on Tue Mar 21, 2013  3:48 PM ------      Message from: Richardson Chiquito      Created: Mon Mar 20, 2013  2:19 PM        We have not gotten signed paperwork back yet. I have left a message on patient's phone (home number) for her to call back.      ----- Message -----         From: Richardson Chiquito, CMA         Sent: 03/20/2013           To: Richardson Chiquito, CMA            Did pt send back signed paperwork for endo on 03/28/13 with pyrtle?       ------

## 2013-03-22 NOTE — Telephone Encounter (Signed)
Patient acknowledgement received. Left message to advise patient that we have received her fax.

## 2013-03-27 ENCOUNTER — Encounter: Payer: Self-pay | Admitting: *Deleted

## 2013-03-28 ENCOUNTER — Ambulatory Visit (AMBULATORY_SURGERY_CENTER): Payer: BC Managed Care – PPO | Admitting: Internal Medicine

## 2013-03-28 ENCOUNTER — Encounter: Payer: Self-pay | Admitting: Internal Medicine

## 2013-03-28 VITALS — BP 116/67 | HR 84 | Temp 97.6°F | Resp 19 | Ht 65.0 in | Wt 132.0 lb

## 2013-03-28 DIAGNOSIS — K219 Gastro-esophageal reflux disease without esophagitis: Secondary | ICD-10-CM

## 2013-03-28 DIAGNOSIS — R1314 Dysphagia, pharyngoesophageal phase: Secondary | ICD-10-CM

## 2013-03-28 MED ORDER — PANTOPRAZOLE SODIUM 40 MG PO TBEC: 40.0000 mg | DELAYED_RELEASE_TABLET | Freq: Every day | ORAL | Status: DC

## 2013-03-28 MED ORDER — SODIUM CHLORIDE 0.9 % IV SOLN: 500.0000 mL | INTRAVENOUS | Status: DC

## 2013-03-28 NOTE — Progress Notes (Signed)
Propofol given over incremental dosagesLidocaine-40mg IV prior to Propofol Induction 

## 2013-03-28 NOTE — Progress Notes (Signed)
Patient did not experience any of the following events: a burn prior to discharge; a fall within the facility; wrong site/side/patient/procedure/implant event; or a hospital transfer or hospital admission upon discharge from the facility. (G8907) Patient did not have preoperative order for IV antibiotic SSI prophylaxis. (G8918)  

## 2013-03-28 NOTE — Op Note (Signed)
[facility] [address] [place], [address]   ENDOSCOPY PROCEDURE REPORT  PATIENT: Monique Pierce, Monique Pierce  MR#: [id number] BIRTHDATE: [birth year] , 26  yrs. old GENDER: Female ENDOSCOPIST: Beverley Fiedler, MD REFERRED BY:  Etta Grandchild, M.D. PROCEDURE DATE:  03/28/2013 PROCEDURE:  EGD, diagnostic ASA CLASS:     Class I INDICATIONS:  Dysphagia.   GERD. MEDICATIONS: MAC sedation, administered by CRNA and propofol (Diprivan) 150mg  IV TOPICAL ANESTHETIC: none  DESCRIPTION OF PROCEDURE: After the risks benefits and alternatives of the procedure were thoroughly explained, informed consent was obtained.  The LB [id number]-[id number] endoscope was introduced through the mouth and advanced to the second portion of the duodenum. Without limitations.  The instrument was slowly withdrawn as the mucosa was fully examined.     The upper, middle and distal third of the esophagus were carefully inspected and no abnormalities were noted.  The z-line was well seen at the GEJ.  The endoscope was pushed into the fundus which was normal including a retroflexed view.  The antrum, gastric body, first and second part of the duodenum were unremarkable. Retroflexed views revealed no abnormalities.     The scope was then withdrawn from the patient and the procedure completed.  COMPLICATIONS: There were no complications.  ENDOSCOPIC IMPRESSION: Normal EGD  RECOMMENDATIONS: 1.  Can continue pantoprazole 40 mg daily.  I recommend checking serum magnesium yearly while on PPI therapy. 2.  Office follow-up as needed.  eSigned:  Beverley Fiedler, MD 03/28/2013 10:21 AM   CC:The Patient

## 2013-03-28 NOTE — Patient Instructions (Addendum)
YOU HAD AN ENDOSCOPIC PROCEDURE TODAY AT THE [facility]: Refer to the procedure report that was given to you for any specific questions about what was found during the examination.  If the procedure report does not answer your questions, please call your gastroenterologist to clarify.  If you requested that your care partner not be given the details of your procedure findings, then the procedure report has been included in a sealed envelope for you to review at your convenience later.  YOU SHOULD EXPECT: Some feelings of bloating in the abdomen. Passage of more gas than usual.  Walking can help get rid of the air that was put into your GI tract during the procedure and reduce the bloating.   DIET: Your first meal following the procedure should be a light meal and then it is ok to progress to your normal diet.  A half-sandwich or bowl of soup is an example of a good first meal.  Heavy or fried foods are harder to digest and may make you feel nauseous or bloated.  Likewise meals heavy in dairy and vegetables can cause extra gas to form and this can also increase the bloating.  Drink plenty of fluids but you should avoid alcoholic beverages for 24 hours.  ACTIVITY: Your care partner should take you home directly after the procedure.  You should plan to take it easy, moving slowly for the rest of the day.  You can resume normal activity the day after the procedure however you should NOT DRIVE or use heavy machinery for 24 hours (because of the sedation medicines used during the test).    SYMPTOMS TO REPORT IMMEDIATELY: A gastroenterologist can be reached at any hour.  During normal business hours, 8:30 AM to 5:00 PM Monday through Friday, call [phone].  After hours and on weekends, please call the GI answering service at [phone] who will take a message and have the physician on call contact you.   Following upper endoscopy (EGD)  Vomiting of blood or coffee ground material  New  chest pain or pain under the shoulder blades  Painful or persistently difficult swallowing  New shortness of breath  Fever of 100F or higher  Black, tarry-looking stools  FOLLOW UP: Our staff will call the home number listed on your records the next business day following your procedure to check on you and address any questions or concerns that you may have at that time regarding the information given to you following your procedure. This is a courtesy call and so if there is no answer at the home number and we have not heard from you through the emergency physician on call, we will assume that you have returned to your regular daily activities without incident.  SIGNATURES/CONFIDENTIALITY: You and/or your care partner have signed paperwork which will be entered into your electronic medical record.  These signatures attest to the fact that that the information above on your After Visit Summary has been reviewed and is understood.  Full responsibility of the confidentiality of this discharge information lies with you and/or your care-partner.  Please follow Dr. Lauro Franklin recommendations about Pantoprazole  Follow up in office as needed  Your refill has been sent to [facility]

## 2013-03-29 ENCOUNTER — Telehealth: Payer: Self-pay | Admitting: *Deleted

## 2013-03-29 NOTE — Telephone Encounter (Signed)
  Follow up Call-  Call back number 03/28/2013  Post procedure Call Back phone  # [phone]  Permission to leave phone message Yes     No answer, left message.

## 2013-09-08 ENCOUNTER — Ambulatory Visit: Payer: BC Managed Care – PPO | Admitting: Internal Medicine

## 2015-08-25 ENCOUNTER — Inpatient Hospital Stay (HOSPITAL_COMMUNITY): Payer: Medicaid Other | Admitting: Anesthesiology

## 2015-08-25 ENCOUNTER — Inpatient Hospital Stay (HOSPITAL_COMMUNITY): Payer: Medicaid Other

## 2015-08-25 ENCOUNTER — Inpatient Hospital Stay (HOSPITAL_COMMUNITY)
Admission: AD | Admit: 2015-08-25 | Discharge: 2015-08-28 | DRG: 766 | Disposition: A | Discharge status: Home / Self Care | Payer: Medicaid Other | Source: Ambulatory Visit | Attending: Obstetrics and Gynecology | Admitting: Obstetrics and Gynecology

## 2015-08-25 ENCOUNTER — Encounter (HOSPITAL_COMMUNITY): Payer: Self-pay | Admitting: *Deleted

## 2015-08-25 ENCOUNTER — Encounter (HOSPITAL_COMMUNITY)
Admission: AD | Disposition: A | Discharge status: Home / Self Care | Payer: Self-pay | Source: Ambulatory Visit | Attending: Obstetrics and Gynecology

## 2015-08-25 DIAGNOSIS — Z803 Family history of malignant neoplasm of breast: Secondary | ICD-10-CM | POA: Diagnosis not present

## 2015-08-25 DIAGNOSIS — O368132 Decreased fetal movements, third trimester, fetus 2: Secondary | ICD-10-CM

## 2015-08-25 DIAGNOSIS — O9962 Diseases of the digestive system complicating childbirth: Secondary | ICD-10-CM | POA: Diagnosis present

## 2015-08-25 DIAGNOSIS — O3413 Maternal care for benign tumor of corpus uteri, third trimester: Secondary | ICD-10-CM | POA: Diagnosis present

## 2015-08-25 DIAGNOSIS — D259 Leiomyoma of uterus, unspecified: Secondary | ICD-10-CM | POA: Diagnosis present

## 2015-08-25 DIAGNOSIS — O368130 Decreased fetal movements, third trimester, not applicable or unspecified: Secondary | ICD-10-CM | POA: Diagnosis present

## 2015-08-25 DIAGNOSIS — K219 Gastro-esophageal reflux disease without esophagitis: Secondary | ICD-10-CM | POA: Diagnosis present

## 2015-08-25 DIAGNOSIS — Z3A39 39 weeks gestation of pregnancy: Secondary | ICD-10-CM | POA: Diagnosis present

## 2015-08-25 DIAGNOSIS — Z98891 History of uterine scar from previous surgery: Secondary | ICD-10-CM

## 2015-08-25 LAB — URINALYSIS, ROUTINE W REFLEX MICROSCOPIC
BILIRUBIN URINE: NEGATIVE
Glucose, UA: NEGATIVE mg/dL
HGB URINE DIPSTICK: NEGATIVE
KETONES UR: NEGATIVE mg/dL
Leukocytes, UA: NEGATIVE
Nitrite: NEGATIVE
PROTEIN: NEGATIVE mg/dL
Specific Gravity, Urine: <1.005 — ABNORMAL LOW (ref 1.005–1.030)
UROBILINOGEN UA: 0.2 mg/dL (ref 0.0–1.0)
pH: 5.5 (ref 5.0–8.0)

## 2015-08-25 LAB — CBC
HEMATOCRIT: 38.7 % (ref 36.0–46.0)
Hemoglobin: 13.2 g/dL (ref 12.0–15.0)
MCH: 32 pg (ref 26.0–34.0)
MCHC: 34.1 g/dL (ref 30.0–36.0)
MCV: 93.9 fL (ref 78.0–100.0)
Platelets: 148 10*3/uL — ABNORMAL LOW (ref 150–400)
RBC: 4.12 MIL/uL (ref 3.87–5.11)
RDW: 13.7 % (ref 11.5–15.5)
WBC: 10 10*3/uL (ref 4.0–10.5)

## 2015-08-25 LAB — TYPE AND SCREEN
ABO/RH(D): B POS
Antibody Screen: NEGATIVE

## 2015-08-25 LAB — POCT PREGNANCY, URINE: PREG TEST UR: POSITIVE — AB

## 2015-08-25 SURGERY — Surgical Case
Anesthesia: Spinal | Site: Abdomen

## 2015-08-25 MED ORDER — MEPERIDINE HCL 25 MG/ML IJ SOLN
INTRAMUSCULAR | Status: AC
  Administered 2015-08-25: 6.25 mg via INTRAVENOUS
  Filled 2015-08-25: qty 1

## 2015-08-25 MED ORDER — FENTANYL CITRATE (PF) 100 MCG/2ML IJ SOLN: 25.0000 ug | INTRAMUSCULAR | Status: DC | PRN

## 2015-08-25 MED ORDER — LACTATED RINGERS IV SOLN: INTRAVENOUS | Status: DC

## 2015-08-25 MED ORDER — FENTANYL CITRATE (PF) 100 MCG/2ML IJ SOLN
INTRAMUSCULAR | Status: AC
  Filled 2015-08-25: qty 4

## 2015-08-25 MED ORDER — MORPHINE SULFATE (PF) 0.5 MG/ML IJ SOLN
INTRAMUSCULAR | Status: DC | PRN
  Administered 2015-08-25: .1 mg via INTRATHECAL

## 2015-08-25 MED ORDER — BUPIVACAINE IN DEXTROSE 0.75-8.25 % IT SOLN
INTRATHECAL | Status: DC | PRN
  Administered 2015-08-25: 10.5 mg via INTRATHECAL

## 2015-08-25 MED ORDER — KETOROLAC TROMETHAMINE 30 MG/ML IJ SOLN: 30.0000 mg | Freq: Four times a day (QID) | INTRAMUSCULAR | Status: DC | PRN

## 2015-08-25 MED ORDER — OXYTOCIN 10 UNIT/ML IJ SOLN
40.0000 [IU] | INTRAVENOUS | Status: DC | PRN
  Administered 2015-08-25: 40 [IU] via INTRAVENOUS

## 2015-08-25 MED ORDER — OXYTOCIN 10 UNIT/ML IJ SOLN
INTRAMUSCULAR | Status: AC
Start: 2015-08-25 — End: 2015-08-25
  Filled 2015-08-25: qty 4

## 2015-08-25 MED ORDER — PHENYLEPHRINE 8 MG IN D5W 100 ML (0.08MG/ML) PREMIX OPTIME
INJECTION | INTRAVENOUS | Status: AC
  Filled 2015-08-25: qty 100

## 2015-08-25 MED ORDER — KETOROLAC TROMETHAMINE 30 MG/ML IJ SOLN
30.0000 mg | Freq: Four times a day (QID) | INTRAMUSCULAR | Status: DC | PRN
  Administered 2015-08-25: 30 mg via INTRAMUSCULAR

## 2015-08-25 MED ORDER — LACTATED RINGERS IV SOLN
INTRAVENOUS | Status: DC | PRN
  Administered 2015-08-25: 20:00:00 via INTRAVENOUS

## 2015-08-25 MED ORDER — ONDANSETRON HCL 4 MG/2ML IJ SOLN
INTRAMUSCULAR | Status: DC | PRN
  Administered 2015-08-25: 4 mg via INTRAVENOUS

## 2015-08-25 MED ORDER — FAMOTIDINE IN NACL 20-0.9 MG/50ML-% IV SOLN
20.0000 mg | Freq: Once | INTRAVENOUS | Status: AC
  Administered 2015-08-25: 20 mg via INTRAVENOUS
  Filled 2015-08-25: qty 50

## 2015-08-25 MED ORDER — MORPHINE SULFATE (PF) 0.5 MG/ML IJ SOLN
INTRAMUSCULAR | Status: AC
  Filled 2015-08-25: qty 100

## 2015-08-25 MED ORDER — KETOROLAC TROMETHAMINE 30 MG/ML IJ SOLN
INTRAMUSCULAR | Status: AC
  Filled 2015-08-25: qty 1

## 2015-08-25 MED ORDER — SCOPOLAMINE 1 MG/3DAYS TD PT72
MEDICATED_PATCH | TRANSDERMAL | Status: DC | PRN
  Administered 2015-08-25: 1 via TRANSDERMAL

## 2015-08-25 MED ORDER — FENTANYL CITRATE (PF) 100 MCG/2ML IJ SOLN
INTRAMUSCULAR | Status: DC | PRN
  Administered 2015-08-25: 12.5 ug via INTRATHECAL

## 2015-08-25 MED ORDER — MEPERIDINE HCL 25 MG/ML IJ SOLN
6.2500 mg | INTRAMUSCULAR | Status: AC | PRN
  Administered 2015-08-25 (×2): 6.25 mg via INTRAVENOUS

## 2015-08-25 MED ORDER — CITRIC ACID-SODIUM CITRATE 334-500 MG/5ML PO SOLN
30.0000 mL | Freq: Once | ORAL | Status: AC
  Administered 2015-08-25: 30 mL via ORAL
  Filled 2015-08-25: qty 15

## 2015-08-25 MED ORDER — PHENYLEPHRINE 8 MG IN D5W 100 ML (0.08MG/ML) PREMIX OPTIME
INJECTION | INTRAVENOUS | Status: DC | PRN
  Administered 2015-08-25: 50 ug/min via INTRAVENOUS

## 2015-08-25 MED ORDER — ONDANSETRON HCL 4 MG/2ML IJ SOLN
INTRAMUSCULAR | Status: AC
  Filled 2015-08-25: qty 2

## 2015-08-25 MED ORDER — SODIUM CHLORIDE 0.9 % IR SOLN
Status: DC | PRN
  Administered 2015-08-25: 1000 mL

## 2015-08-25 MED ORDER — CEFAZOLIN SODIUM-DEXTROSE 2-3 GM-% IV SOLR
INTRAVENOUS | Status: DC | PRN
  Administered 2015-08-25: 2 g via INTRAVENOUS

## 2015-08-25 MED ORDER — LACTATED RINGERS IV SOLN
INTRAVENOUS | Status: DC | PRN
  Administered 2015-08-25 (×2): via INTRAVENOUS

## 2015-08-25 SURGICAL SUPPLY — 40 items
APL SKNCLS STERI-STRIP NONHPOA (GAUZE/BANDAGES/DRESSINGS) ×1
BARRIER ADHS 3X4 INTERCEED (GAUZE/BANDAGES/DRESSINGS) IMPLANT
BENZOIN TINCTURE PRP APPL 2/3 (GAUZE/BANDAGES/DRESSINGS) ×1 IMPLANT
BRR ADH 4X3 ABS CNTRL BYND (GAUZE/BANDAGES/DRESSINGS)
CLAMP CORD UMBIL (MISCELLANEOUS) IMPLANT
CLOTH BEACON ORANGE TIMEOUT ST (SAFETY) ×2 IMPLANT
CONTAINER PREFILL 10% NBF 15ML (MISCELLANEOUS) IMPLANT
DRAPE SHEET LG 3/4 BI-LAMINATE (DRAPES) IMPLANT
DRSG OPSITE POSTOP 4X10 (GAUZE/BANDAGES/DRESSINGS) ×2 IMPLANT
DURAPREP 26ML APPLICATOR (WOUND CARE) ×2 IMPLANT
ELECT REM PT RETURN 9FT ADLT (ELECTROSURGICAL) ×2
ELECTRODE REM PT RTRN 9FT ADLT (ELECTROSURGICAL) ×1 IMPLANT
EXTRACTOR VACUUM M CUP 4 TUBE (SUCTIONS) IMPLANT
GLOVE BIO SURGEON STRL SZ 6.5 (GLOVE) ×2 IMPLANT
GLOVE BIOGEL PI IND STRL 7.0 (GLOVE) IMPLANT
GLOVE BIOGEL PI IND STRL 7.5 (GLOVE) IMPLANT
GLOVE BIOGEL PI IND STRL 8 (GLOVE) IMPLANT
GLOVE BIOGEL PI INDICATOR 7.0 (GLOVE) ×1
GLOVE BIOGEL PI INDICATOR 7.5 (GLOVE) ×1
GLOVE BIOGEL PI INDICATOR 8 (GLOVE) ×1
GLOVE SURG SS PI 7.0 STRL IVOR (GLOVE) ×1 IMPLANT
GOWN STRL REUS W/TWL LRG LVL3 (GOWN DISPOSABLE) ×4 IMPLANT
KIT ABG SYR 3ML LUER SLIP (SYRINGE) IMPLANT
NDL HYPO 25X5/8 SAFETYGLIDE (NEEDLE) ×1 IMPLANT
NEEDLE HYPO 22GX1.5 SAFETY (NEEDLE) IMPLANT
NEEDLE HYPO 25X5/8 SAFETYGLIDE (NEEDLE) ×2 IMPLANT
NS IRRIG 1000ML POUR BTL (IV SOLUTION) ×2 IMPLANT
PACK C SECTION WH (CUSTOM PROCEDURE TRAY) ×2 IMPLANT
PAD OB MATERNITY 4.3X12.25 (PERSONAL CARE ITEMS) ×2 IMPLANT
PENCIL SMOKE EVAC W/HOLSTER (ELECTROSURGICAL) ×2 IMPLANT
STRIP CLOSURE SKIN 1/2X4 (GAUZE/BANDAGES/DRESSINGS) ×1 IMPLANT
SUT CHROMIC 0 CTX 36 (SUTURE) ×4 IMPLANT
SUT PLAIN 0 NONE (SUTURE) IMPLANT
SUT PLAIN 2 0 XLH (SUTURE) IMPLANT
SUT VIC AB 0 CT1 27 (SUTURE) ×6
SUT VIC AB 0 CT1 27XBRD ANBCTR (SUTURE) ×3 IMPLANT
SUT VIC AB 4-0 KS 27 (SUTURE) IMPLANT
SYR CONTROL 10ML LL (SYRINGE) IMPLANT
TOWEL OR 17X24 6PK STRL BLUE (TOWEL DISPOSABLE) ×2 IMPLANT
TRAY FOLEY CATH SILVER 14FR (SET/KITS/TRAYS/PACK) ×2 IMPLANT

## 2015-08-25 NOTE — Transfer of Care (Signed)
Immediate Anesthesia Transfer of Care Note  Patient: Monique Pierce  Procedure(s) Performed: Procedure(s): CESAREAN SECTION (N/A)  Patient Location: PACU  Anesthesia Type:Spinal  Level of Consciousness: awake  Airway & Oxygen Therapy: Patient Spontanous Breathing  Post-op Assessment: Report given to RN and Post -op Vital signs reviewed and stable  Post vital signs: stable  Last Vitals:  Filed Vitals:   08/25/15 1942  BP: 100/52  Pulse: 105  Temp:   Resp:     Complications: No apparent anesthesia complications

## 2015-08-25 NOTE — MAU Note (Signed)
Ultrasound at bedside

## 2015-08-25 NOTE — MAU Provider Note (Signed)
  History     CSN: [id number]  Arrival date and time: 08/25/15 1437   First Provider Initiated Contact with Patient 08/25/15 1504      Chief Complaint  Patient presents with  . Decreased Fetal Movement   HPI  Monique Pierce is a 29 y.o. G1P0 at [redacted]w[redacted]d/. She presents with decreased fetal movement today. No leaking or bleeding, has occ cramps. Last OV was 9/15 with Dr Corinna Capra, her cx was closed.  OB History    Gravida Para Term Preterm AB TAB SAB Ectopic Multiple Living   1         0      Past Medical History  Diagnosis Date  . GERD (gastroesophageal reflux disease)     Past Surgical History  Procedure Laterality Date  . No past surgeries      Family History  Problem Relation Age of Onset  . Cancer Neg Hx   . Heart disease Neg Hx   . Breast cancer Maternal Grandmother     Social History  Substance Use Topics  . Smoking status: Never Smoker   . Smokeless tobacco: Never Used  . Alcohol Use: No    Allergies: No Known Allergies  Prescriptions prior to admission  Medication Sig Dispense Refill Last Dose  . Prenatal Vit-Fe Fumarate-FA (PRENATAL MULTIVITAMIN) TABS tablet Take 1 tablet by mouth at bedtime.   08/24/2015 at Unknown time  . bifidobacterium infantis (ALIGN) capsule Take 1 capsule by mouth daily. (Patient not taking: Reported on 08/25/2015) 14 capsule 0 Not Taking at Unknown time  . pantoprazole (PROTONIX) 40 MG tablet Take 1 tablet (40 mg total) by mouth daily. (Patient not taking: Reported on 08/25/2015) 30 tablet 6 Not Taking at Unknown time    Review of Systems  Constitutional: Negative for fever and chills.  Gastrointestinal: Negative for nausea, vomiting and abdominal pain.  Genitourinary: Negative for dysuria, urgency and frequency.   Physical Exam   Blood pressure 108/67, pulse 94, temperature 98.7 F (37.1 C), temperature source Oral, resp. rate 18, SpO2 97 %.  Physical Exam  Nursing note and vitals reviewed. Constitutional: She is oriented to  person, place, and time. She appears well-developed and well-nourished.  GI: Soft. She exhibits no distension. There is no tenderness.  Musculoskeletal: Normal range of motion.  Neurological: She is alert and oriented to person, place, and time.  Skin: Skin is warm and dry.  Psychiatric: She has a normal mood and affect. Her behavior is normal.    MAU Course  Procedures  MDM  4:15 pm- Her EFM strip was reactive when she arrived. She went to the BR, back on the monitor, it was flat with ~ 5 beats variability, subtle decelerations. She was not contracting, still mild irreg cramps. I consulted with Dr Helane Rima, who ordered a BPP and AFI. 7:15 pm-She has had 2 spont decelerations to 60-90 with recovery, reassuring strip now. BPP results still pending. Consulted with Dr Helane Rima, she plans to come in and do a C/S. I informed the pt and answered questions.   Assessment and Plan  Fetal distress at 39 5/7 wks For C/S   Elesa Massed. 08/25/2015, 7:17 PM

## 2015-08-25 NOTE — Anesthesia Preprocedure Evaluation (Signed)
Anesthesia Evaluation  Patient identified by MRN, date of birth, ID band Patient awake    Reviewed: Allergy & Precautions, NPO status , Patient's Chart, lab work & pertinent test results  Airway Mallampati: II  TM Distance: >3 FB Neck ROM: Full    Dental no notable dental hx.    Pulmonary neg pulmonary ROS,    Pulmonary exam normal breath sounds clear to auscultation       Cardiovascular negative cardio ROS Normal cardiovascular exam Rhythm:Regular Rate:Normal     Neuro/Psych negative neurological ROS  negative psych ROS   GI/Hepatic negative GI ROS, Neg liver ROS,   Endo/Other  negative endocrine ROS  Renal/GU negative Renal ROS  negative genitourinary   Musculoskeletal negative musculoskeletal ROS (+)   Abdominal   Peds negative pediatric ROS (+)  Hematology negative hematology ROS (+)   Anesthesia Other Findings   Reproductive/Obstetrics (+) Pregnancy                             Anesthesia Physical Anesthesia Plan  ASA: II and emergent  Anesthesia Plan: Spinal   Post-op Pain Management:    Induction:   Airway Management Planned: Natural Airway  Additional Equipment:   Intra-op Plan:   Post-operative Plan:   Informed Consent: I have reviewed the patients History and Physical, chart, labs and discussed the procedure including the risks, benefits and alternatives for the proposed anesthesia with the patient or authorized representative who has indicated his/her understanding and acceptance.   Dental advisory given  Plan Discussed with: CRNA  Anesthesia Plan Comments:         Anesthesia Quick Evaluation

## 2015-08-25 NOTE — H&P (Signed)
Monique Pierce is a 29 y.o. G 1 P 0 at 2 w 5 days presents with decreased fetal movement today.  In triage,  FHR initially reactive but repetitive decelerations noted with change in baseline and questionable late appearing decelerations.  Cervix long / closed in office this week. Maternal Medical History:  Fetal activity: Perceived fetal activity is decreased.      OB History    Gravida Para Term Preterm AB TAB SAB Ectopic Multiple Living   1         0     Past Medical History  Diagnosis Date  . GERD (gastroesophageal reflux disease)    Past Surgical History  Procedure Laterality Date  . No past surgeries     Family History: family history includes Breast cancer in her maternal grandmother. There is no history of Cancer or Heart disease. Social History:  reports that she has never smoked. She has never used smokeless tobacco. She reports that she does not drink alcohol or use illicit drugs.   Prenatal Transfer Tool  Maternal Diabetes: No Genetic Screening: Normal Maternal Ultrasounds/Referrals: Normal Fetal Ultrasounds or other Referrals:  None Maternal Substance Abuse:  No Significant Maternal Medications:  None Significant Maternal Lab Results:  None Other Comments:  None  Review of Systems  All other systems reviewed and are negative.     Blood pressure 108/67, pulse 94, temperature 98.7 F (37.1 C), temperature source Oral, resp. rate 18, SpO2 97 %.   Fetal Exam Fetal State Assessment: Category III - tracings are abnormal.     Physical Exam  Nursing note and vitals reviewed. Constitutional: She appears well-developed.  HENT:  Head: Normocephalic.  Eyes: Pupils are equal, round, and reactive to light.  Cardiovascular: Normal rate and regular rhythm.     Prenatal labs: ABO, Rh:   Antibody:   Rubella:   RPR:    HBsAg:    HIV:    GBS:     Assessment/Plan: IUP at 50 w 5 days Decreased fetal movement Non reassuring FHR Pattern Proceed with Primary  LTCS now   GREWAL,MICHELLE L 08/25/2015, 7:37 PM

## 2015-08-25 NOTE — Anesthesia Procedure Notes (Signed)
Spinal Patient location during procedure: OR Staffing Anesthesiologist: Montez Hageman Performed by: anesthesiologist  Preanesthetic Checklist Completed: patient identified, site marked, surgical consent, pre-op evaluation, timeout performed, IV checked, risks and benefits discussed and monitors and equipment checked Spinal Block Patient position: sitting Prep: DuraPrep Patient monitoring: heart rate, continuous pulse ox and blood pressure Approach: midline Location: L4-5 Injection technique: single-shot Needle Needle type: Sprotte  Needle gauge: 24 G Needle length: 9 cm Additional Notes Expiration date of kit checked and confirmed. Patient tolerated procedure well, without complications.

## 2015-08-25 NOTE — Anesthesia Postprocedure Evaluation (Signed)
  Anesthesia Post-op Note  Patient: Monique Pierce  Procedure(s) Performed: Procedure(s) (LRB): CESAREAN SECTION (N/A)  Patient Location: PACU  Anesthesia Type: Spinal  Level of Consciousness: awake and alert   Airway and Oxygen Therapy: Patient Spontanous Breathing  Post-op Pain: mild  Post-op Assessment: Post-op Vital signs reviewed, Patient's Cardiovascular Status Stable, Respiratory Function Stable, Patent Airway and No signs of Nausea or vomiting  Last Vitals:  Filed Vitals:   08/25/15 2145  BP: 122/67  Pulse: 102  Temp:   Resp: 17    Post-op Vital Signs: stable   Complications: No apparent anesthesia complications

## 2015-08-25 NOTE — Op Note (Signed)
Monique Pierce, Monique Pierce                ACCOUNT NO.:  [id number]  MEDICAL RECORD NO.:  [id number]  LOCATION:  [facility]                          FACILITY:  [facility]  PHYSICIAN:  Michelle L. Grewal, M.D.DATE OF BIRTH:  1986/04/24  DATE OF PROCEDURE:  08/25/2015 DATE OF DISCHARGE:                              OPERATIVE REPORT   PREOPERATIVE DIAGNOSES: 1. Intrauterine pregnancy at 39-5/7. 2. Nonreassuring fetal heart rate.  POSTOPERATIVE DIAGNOSES: 1. Intrauterine pregnancy at 39-5/7. 2. Nonreassuring fetal heart rate. 3. Tight nuchal cord x1.  PROCEDURE:  Primary low transverse cesarean section.  SURGEON:  Michelle L. Grewal, MD  ANESTHESIA:  Spinal.  EBL:  400 mL.  COMPLICATIONS:  None.  DRAINS:  Foley catheter.  SPECIMEN:  Placenta sent to Pathology.  PROCEDURE IN DETAIL:  Patient was taken urgently to the operating room, where spinal was placed.  She was prepped and draped.  A Foley catheter was inserted.  A time-out was performed.  A low transverse incision was made carried down to the fascia.  Fascia was scored in the midline, extended laterally.  Rectus muscles were separated in the midline.  The peritoneum was entered bluntly.  The peritoneal incision was then stretched.  The bladder blade was inserted.  The lower uterine segment was identified.  The bladder flap was created.  A low transverse incision was made in the uterus.  Uterus was entered using a hemostat. Amniotic fluid was clear.  The baby was delivered easily with 1 gentle pull of the mushroom, it was a female infant, there was a very tightened nuchal cord x1 that was reduced.  The baby's Apgars were 9 at 1 minute and 9 at 5 minutes.  The cord was clamped and cut.  After a delay, because the baby was doing so well, the cord blood was obtained.  The placenta was manually removed, noted to be normal, intact with three- vessel cord, but the cord was very long and thin, it was sent to pathology for analysis.  The uterus  was exteriorized.  It was cleared of all clots and debris.  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 The adnexa were normal and the uterine incision was closed with 0 Vicryl running locked stitch.  Antibiotics and Pitocin were given.  The uterus was firm.  The uterus was returned to the abdomen.  Irrigation was performed.  The peritoneum was closed using 0 Vicryl and rectus muscles were reapproximated using 0 Vicryl.  The fascia was closed using 0 Vicryl starting each corner meeting in the midline.  After irrigation of subcutaneous layer, the skin was closed with a 4-0 Vicryl on a Keith needle.  Steri-Strips and a honeycomb dressing were applied.  All sponge, lap, and instrument counts were correct x2.  Patient went to recovery room in stable condition.     Michelle L. Helane Rima, M.D.     Nevin Bloodgood  D:  08/25/2015  T:  08/25/2015  Job:  [id number]

## 2015-08-25 NOTE — Brief Op Note (Signed)
08/25/2015  8:18 PM  PATIENT:  Monique Pierce  29 y.o. female  PRE-OPERATIVE DIAGNOSIS:   IUP at 46 w 5 days Non reassuring FHR  POST-OPERATIVE DIAGNOSIS:   Same Tight Nuchal cord  PROCEDURE:  Procedure(s): CESAREAN SECTION (N/A)  SURGEON:  Surgeon(s) and Role:    * Dian Queen, MD - Primary  PHYSICIAN ASSISTANT:   ASSISTANTS: none   ANESTHESIA:   spinal  EBL:  Total I/O In: 1000 [I.V.:1000] Out: 1300 [Urine:900; Blood:400]  BLOOD ADMINISTERED:none  DRAINS: Urinary Catheter (Foley)   LOCAL MEDICATIONS USED:  NONE  SPECIMEN:  Source of Specimen:  placenta  DISPOSITION OF SPECIMEN:  PATHOLOGY  COUNTS:  YES  TOURNIQUET:  * No tourniquets in log *  DICTATION: .Other Dictation: Dictation Number [id number]  PLAN OF CARE: Admit to inpatient   PATIENT DISPOSITION:  PACU - hemodynamically stable.   Delay start of Pharmacological VTE agent (>24hrs) due to surgical blood loss or risk of bleeding: not applicable

## 2015-08-26 ENCOUNTER — Encounter (HOSPITAL_COMMUNITY): Payer: Self-pay | Admitting: Anesthesiology

## 2015-08-26 ENCOUNTER — Encounter (HOSPITAL_COMMUNITY): Payer: Self-pay | Admitting: Obstetrics and Gynecology

## 2015-08-26 DIAGNOSIS — Z98891 History of uterine scar from previous surgery: Secondary | ICD-10-CM

## 2015-08-26 LAB — CBC
HEMATOCRIT: 37 % (ref 36.0–46.0)
Hemoglobin: 12.9 g/dL (ref 12.0–15.0)
MCH: 33 pg (ref 26.0–34.0)
MCHC: 34.9 g/dL (ref 30.0–36.0)
MCV: 94.6 fL (ref 78.0–100.0)
PLATELETS: 134 10*3/uL — AB (ref 150–400)
RBC: 3.91 MIL/uL (ref 3.87–5.11)
RDW: 13.8 % (ref 11.5–15.5)
WBC: 15.4 10*3/uL — ABNORMAL HIGH (ref 4.0–10.5)

## 2015-08-26 LAB — ABO/RH: ABO/RH(D): B POS

## 2015-08-26 LAB — RPR: RPR: NONREACTIVE

## 2015-08-26 LAB — HIV ANTIBODY (ROUTINE TESTING W REFLEX): HIV Screen 4th Generation wRfx: NONREACTIVE

## 2015-08-26 MED ORDER — DIPHENHYDRAMINE HCL 25 MG PO CAPS
25.0000 mg | ORAL_CAPSULE | ORAL | Status: DC | PRN
  Administered 2015-08-26: 25 mg via ORAL
  Filled 2015-08-26: qty 1

## 2015-08-26 MED ORDER — SIMETHICONE 80 MG PO CHEW
80.0000 mg | CHEWABLE_TABLET | ORAL | Status: DC
  Administered 2015-08-27 (×2): 80 mg via ORAL
  Filled 2015-08-26 (×2): qty 1

## 2015-08-26 MED ORDER — PRENATAL MULTIVITAMIN CH
1.0000 | ORAL_TABLET | Freq: Every day | ORAL | Status: DC
  Administered 2015-08-26 – 2015-08-28 (×3): 1 via ORAL
  Filled 2015-08-26 (×3): qty 1

## 2015-08-26 MED ORDER — TETANUS-DIPHTH-ACELL PERTUSSIS 5-2.5-18.5 LF-MCG/0.5 IM SUSP: 0.5000 mL | Freq: Once | INTRAMUSCULAR | Status: DC

## 2015-08-26 MED ORDER — IBUPROFEN 600 MG PO TABS
600.0000 mg | ORAL_TABLET | Freq: Four times a day (QID) | ORAL | Status: DC
  Administered 2015-08-26 – 2015-08-28 (×10): 600 mg via ORAL
  Filled 2015-08-26 (×10): qty 1

## 2015-08-26 MED ORDER — DIPHENHYDRAMINE HCL 25 MG PO CAPS: 25.0000 mg | ORAL_CAPSULE | Freq: Four times a day (QID) | ORAL | Status: DC | PRN

## 2015-08-26 MED ORDER — NALBUPHINE HCL 10 MG/ML IJ SOLN
5.0000 mg | Freq: Once | INTRAMUSCULAR | Status: DC | PRN
  Filled 2015-08-26: qty 0.5

## 2015-08-26 MED ORDER — DIPHENHYDRAMINE HCL 50 MG/ML IJ SOLN: 12.5000 mg | INTRAMUSCULAR | Status: DC | PRN

## 2015-08-26 MED ORDER — NALBUPHINE HCL 10 MG/ML IJ SOLN
5.0000 mg | INTRAMUSCULAR | Status: DC | PRN
  Filled 2015-08-26: qty 0.5

## 2015-08-26 MED ORDER — OXYCODONE-ACETAMINOPHEN 5-325 MG PO TABS
1.0000 | ORAL_TABLET | ORAL | Status: DC | PRN
  Administered 2015-08-27 (×3): 1 via ORAL
  Filled 2015-08-26 (×3): qty 1

## 2015-08-26 MED ORDER — WITCH HAZEL-GLYCERIN EX PADS: 1.0000 "application " | MEDICATED_PAD | CUTANEOUS | Status: DC | PRN

## 2015-08-26 MED ORDER — SIMETHICONE 80 MG PO CHEW: 80.0000 mg | CHEWABLE_TABLET | ORAL | Status: DC | PRN

## 2015-08-26 MED ORDER — LACTATED RINGERS IV SOLN
INTRAVENOUS | Status: DC
  Administered 2015-08-26: 07:00:00 via INTRAVENOUS

## 2015-08-26 MED ORDER — ACETAMINOPHEN 325 MG PO TABS: 650.0000 mg | ORAL_TABLET | ORAL | Status: DC | PRN

## 2015-08-26 MED ORDER — SODIUM CHLORIDE 0.9 % IJ SOLN: 3.0000 mL | INTRAMUSCULAR | Status: DC | PRN

## 2015-08-26 MED ORDER — SIMETHICONE 80 MG PO CHEW
80.0000 mg | CHEWABLE_TABLET | Freq: Three times a day (TID) | ORAL | Status: DC
  Administered 2015-08-26 – 2015-08-28 (×6): 80 mg via ORAL
  Filled 2015-08-26 (×7): qty 1

## 2015-08-26 MED ORDER — ZOLPIDEM TARTRATE 5 MG PO TABS: 5.0000 mg | ORAL_TABLET | Freq: Every evening | ORAL | Status: DC | PRN

## 2015-08-26 MED ORDER — LANOLIN HYDROUS EX OINT: 1.0000 "application " | TOPICAL_OINTMENT | CUTANEOUS | Status: DC | PRN

## 2015-08-26 MED ORDER — DIBUCAINE 1 % RE OINT: 1.0000 "application " | TOPICAL_OINTMENT | RECTAL | Status: DC | PRN

## 2015-08-26 MED ORDER — NALOXONE HCL 0.4 MG/ML IJ SOLN: 0.4000 mg | INTRAMUSCULAR | Status: DC | PRN

## 2015-08-26 MED ORDER — ONDANSETRON HCL 4 MG/2ML IJ SOLN: 4.0000 mg | Freq: Three times a day (TID) | INTRAMUSCULAR | Status: DC | PRN

## 2015-08-26 MED ORDER — NALBUPHINE HCL 10 MG/ML IJ SOLN
5.0000 mg | Freq: Once | INTRAMUSCULAR | Status: DC | PRN
Start: 2015-08-26 — End: 2015-08-28
  Filled 2015-08-26: qty 0.5

## 2015-08-26 MED ORDER — OXYCODONE-ACETAMINOPHEN 5-325 MG PO TABS: 2.0000 | ORAL_TABLET | ORAL | Status: DC | PRN

## 2015-08-26 MED ORDER — NALOXONE HCL 1 MG/ML IJ SOLN
1.0000 ug/kg/h | INTRAVENOUS | Status: DC | PRN
  Filled 2015-08-26: qty 2

## 2015-08-26 MED ORDER — SENNOSIDES-DOCUSATE SODIUM 8.6-50 MG PO TABS
2.0000 | ORAL_TABLET | ORAL | Status: DC
Start: 2015-08-26 — End: 2015-08-28
  Administered 2015-08-27 (×2): 2 via ORAL
  Filled 2015-08-26 (×2): qty 2

## 2015-08-26 MED ORDER — SCOPOLAMINE 1 MG/3DAYS TD PT72
1.0000 | MEDICATED_PATCH | Freq: Once | TRANSDERMAL | Status: DC
  Filled 2015-08-26: qty 1

## 2015-08-26 MED ORDER — MENTHOL 3 MG MT LOZG: 1.0000 | LOZENGE | OROMUCOSAL | Status: DC | PRN

## 2015-08-26 MED ORDER — OXYTOCIN 40 UNITS IN LACTATED RINGERS INFUSION - SIMPLE MED: 62.5000 mL/h | INTRAVENOUS | Status: AC

## 2015-08-26 NOTE — Anesthesia Postprocedure Evaluation (Signed)
  Anesthesia Post-op Note  Patient: Teniqua Marron Bonillas  Procedure(s) Performed: Procedure(s): CESAREAN SECTION (N/A)  Patient Location: Mother/Baby  Anesthesia Type:Spinal  Level of Consciousness: awake, alert  and oriented  Airway and Oxygen Therapy: Patient Spontanous Breathing  Post-op Pain: mild  Post-op Assessment: Post-op Vital signs reviewed, Patient's Cardiovascular Status Stable, Respiratory Function Stable, Patent Airway, No signs of Nausea or vomiting, Adequate PO intake and Pain level controlled LLE Motor Response: Purposeful movement LLE Sensation: Tingling RLE Motor Response: Purposeful movement RLE Sensation: Tingling      Post-op Vital Signs: Reviewed and stable  Last Vitals:  Filed Vitals:   08/26/15 0600  BP: 107/64  Pulse: 91  Temp: 37.3 C  Resp: 20    Complications: No apparent anesthesia complications

## 2015-08-26 NOTE — Progress Notes (Signed)
Subjective: Postpartum Day 1: Cesarean Delivery Patient reports tolerating PO and + flatus.    Objective: Vital signs in last 24 hours: Temp:  [97.7 F (36.5 C)-99.2 F (37.3 C)] 98.7 F (37.1 C) (09/19 0914) Pulse Rate:  [84-118] 84 (09/19 0914) Resp:  [13-20] 18 (09/19 0914) BP: (95-126)/(42-72) 105/62 mmHg (09/19 0914) SpO2:  [93 %-100 %] 98 % (09/19 0914) Weight:  [160 lb (72.576 kg)] 160 lb (72.576 kg) (09/19 0145)  Physical Exam:  General: alert, cooperative, appears stated age and no distress Lochia: appropriate Uterine Fundus: firm Incision: healing well DVT Evaluation: No evidence of DVT seen on physical exam.   Recent Labs  08/25/15 1930 08/26/15 0710  HGB 13.2 12.9  HCT 38.7 37.0    Assessment/Plan: Status post Cesarean section. Doing well postoperatively.  Continue current care.  LOWE,DAVID C 08/26/2015, 9:17 AM

## 2015-08-26 NOTE — Progress Notes (Signed)
Tried to latch infant unsuccessfully several times. Nipples have short shaft and infant unable to sustain a latch. Parents becoming very anxious stating "she is really trying to latch and she cannot seem to". # 16 nipple shield tried and infant was able to latch.

## 2015-08-26 NOTE — Progress Notes (Signed)
Mother noticed blood in nipple shield after previous nursing. # 20 nipple shield used without problems. Mother instructed to look after nursing to see if colostrum is in  nipple shield. Mother stated "I did not see any colostrum in the shield". Earlier in shift attempted to teach mother how to hand express colostrum. 0 colostrum noted at that time. Will continue to monitor and also  to have lactation consult today.

## 2015-08-26 NOTE — Lactation Note (Signed)
This note was copied from the chart of Monique Delisa Finck. Lactation Consultation Note  Mom is concerned that she has no milk. She has been supplementing the baby because she is not satisfied at the breast.   Baby is currently sound asleep on her chest.  Reviewed hand expression with mom with no colostrum expressed.  She reports some breast change with pregnancy.  Her breasts are well shaped in all quadrants.  Encouraged her to continue latching with the nipple shield.  She will follow feedings with formula if baby is not satisfied.  Follow-up tomorrow.  Patient Name: Monique Pierce [id number] Date: 08/26/2015     Maternal Data    Feeding    LATCH Score/Interventions                      Lactation Tools Discussed/Used     Consult Status      Van Clines 08/26/2015, 9:04 PM

## 2015-08-26 NOTE — Addendum Note (Signed)
Addendum  created 08/26/15 0742 by Riki Sheer, CRNA   Modules edited: Notes Section   Notes Section:  File: [id number]

## 2015-08-26 NOTE — Progress Notes (Signed)
UR chart review completed.  

## 2015-08-26 NOTE — Plan of Care (Signed)
Problem: Phase I Progression Outcomes Goal: OOB as tolerated unless otherwise ordered Outcome: Progressing Patient unable to ambulate due to dizziness. Was able to stand at bedside.

## 2015-08-26 NOTE — Progress Notes (Signed)
Honeycomb dressing saturated and reinforced dressing is not sticking to area. Honeycomb dressing changed and reinforced dressing reapplied.

## 2015-08-26 NOTE — Lactation Note (Signed)
This note was copied from the chart of Monique Ashani Pumphrey. Lactation Consultation Note  Left [place] phone number in morning to call with next feeding and parents did not call. At 1525 revisited room and mother states she recently attempted and baby did not latch. Mother states they thought they would stop trying now due to visitors outside of door. Suggest STS to interest baby in feeding and mother should call for assistance.   Patient Name: Monique Pierce [id number] Date: 08/26/2015 Reason for consult: Initial assessment   Maternal Data    Feeding    LATCH Score/Interventions                      Lactation Tools Discussed/Used     Consult Status Consult Status: Follow-up Date: 08/26/15 Follow-up type: In-patient    [name] [facility] 08/26/2015, 3:28 PM

## 2015-08-26 NOTE — Progress Notes (Signed)
Mother noticed blood in nipple shield after nursing with a number 20. Denied any discomfort while nursing, stating "this nipple shields feels better than the last one". Will refer to lactation.

## 2015-08-27 ENCOUNTER — Encounter (HOSPITAL_COMMUNITY): Payer: Self-pay | Admitting: *Deleted

## 2015-08-27 NOTE — Lactation Note (Signed)
This note was copied from the chart of Monique Pierce. Lactation Consultation Note: Asked by Dr. Earle Gell to see mom. Mom is concerned about not having any milk. Baby very fussy after feedings. RN states she tried hand expression and manual pump and mom unable to obtain any Colostrum. Baby asleep at present. Had a feeding about 2 hours ago and had formula afterwards. Offered DEBP and mom agreeable. Setup for mom -reviewed setup and cleaning of pump pieces. Mom pumping as I left room. Discussed with Evening RN and suggested trying feeding tube and syringe at the breast to supplement while baby is at breast. Mom has [place] and encouraged to call [facility] and see if she can get one from them. Discussed [place] loaner for pump for home. No questions at present. To call prn  Patient Name: Monique Cherae Marton [id number] Date: 08/27/2015 Reason for consult: Follow-up assessment   Maternal Data Formula Feeding for Exclusion: No Has patient been taught Hand Expression?: Yes Does the patient have breastfeeding experience prior to this delivery?: No  Feeding Feeding Type: Bottle Fed - Formula Length of feed: 20 min (on & off the breast)  LATCH Score/Interventions Latch: Grasps breast easily, tongue down, lips flanged, rhythmical sucking. Intervention(s): Adjust position;Assist with latch;Breast massage;Breast compression  Audible Swallowing: None (unable to express colostrum, nipple shield for deeper latch) Intervention(s): Skin to skin;Hand expression (formula with syringe while breastfeeding used to help stimul) Intervention(s): Alternate breast massage  Type of Nipple: Everted at rest and after stimulation Intervention(s): Hand pump  Comfort (Breast/Nipple): Soft / non-tender  Problem noted: Mild/Moderate discomfort  Hold (Positioning): Assistance needed to correctly position infant at breast and maintain latch.  LATCH Score: 7  Lactation Tools Discussed/Used WIC Program: Yes Pump Review: Setup,  frequency, and cleaning Initiated by:: dw Date initiated:: 08/27/15   Consult Status Consult Status: Follow-up Date: 08/28/15 Follow-up type: In-patient    Truddie Crumble 08/27/2015, 3:32 PM

## 2015-08-27 NOTE — Progress Notes (Signed)
Subjective: Postpartum Day two: Cesarean Delivery Patient reports tolerating PO, + flatus and no problems voiding.    Objective: Vital signs in last 24 hours: Temp:  [98 F (36.7 C)-98.5 F (36.9 C)] 98.2 F (36.8 C) (09/20 0550) Pulse Rate:  [73-78] 74 (09/20 0550) Resp:  [17-20] 17 (09/20 0550) BP: (98-112)/(57-65) 98/57 mmHg (09/20 0550) SpO2:  [97 %-99 %] 97 % (09/20 0550)  Physical Exam:  General: alert Lochia: appropriate Uterine Fundus: firm Incision: healing well DVT Evaluation: No evidence of DVT seen on physical exam.   Recent Labs  08/25/15 1930 08/26/15 0710  HGB 13.2 12.9  HCT 38.7 37.0    Assessment/Plan: Status post Cesarean section. Doing well postoperatively.  Continue current care.  MCCOMB,JOHN S 08/27/2015, 10:17 AM

## 2015-08-28 MED ORDER — OXYCODONE-ACETAMINOPHEN 5-325 MG PO TABS: 1.0000 | ORAL_TABLET | ORAL | Status: DC | PRN

## 2015-08-28 MED ORDER — IBUPROFEN 600 MG PO TABS: 600.0000 mg | ORAL_TABLET | Freq: Four times a day (QID) | ORAL | Status: DC

## 2015-08-28 NOTE — Discharge Summary (Signed)
Obstetric Discharge Summary Reason for Admission: onset of labor Prenatal Procedures: ultrasound Intrapartum Procedures: cesarean: low cervical, transverse Postpartum Procedures: none Complications-Operative and Postpartum: none HEMOGLOBIN  Date Value Ref Range Status  08/26/2015 12.9 12.0 - 15.0 g/dL Final   HCT  Date Value Ref Range Status  08/26/2015 37.0 36.0 - 46.0 % Final    Physical Exam:  General: alert and cooperative Lochia: appropriate Uterine Fundus: firm Incision: no significant drainage DVT Evaluation: No evidence of DVT seen on physical exam.  Discharge Diagnoses: Term Pregnancy-delivered  Discharge Information: Date: 08/28/2015 Activity: pelvic rest Diet: routine Medications: PNV, Ibuprofen and Percocet Condition: stable Instructions: refer to practice specific booklet Discharge to: home Follow-up Information    Schedule an appointment as soon as possible for a visit in 2 weeks to follow up.      Newborn Data: Live born female  Birth Weight: 6 lb 1.9 oz (2775 g) APGAR: 9, 9  Home with mother.  ADKINS,GRETCHEN 08/28/2015, 9:59 AM

## 2015-08-28 NOTE — Lactation Note (Signed)
This note was copied from the chart of Monique Bettye Sitton. Lactation Consultation Note  Mom is still not able to express best milk.  This morning we worked with the NS and SNS.  Baby is not able to transfer from the SNS independently.  An air leak was was heard when the tube was behind the nipple shield.  It was removed and placed in the corner of the baby's mouth.  With position adjustment the air leak ceased though baby was still not able to transfer without assistance.  Explained to Dad to initiate with the 12 ml syringe in an effort to encourage independent transfer.  If Pincus Large did not transfer then Dad could push it and change to the larger syringe.  Goal is for baby to make a better seal and transfer.  Mom is to continue pumping every 2-3 hours and to follow-up with [facility].  Mom will obtain pump from [facility] today.  Patient Name: Monique Pierce HAFBX'U Date: 08/28/2015 Reason for consult: Follow-up assessment;Other (Comment) (not able to express milk)   Maternal Data    Feeding Feeding Type: Breast Fed Length of feed: 15 min  LATCH Score/Interventions Latch: Repeated attempts needed to sustain latch, nipple held in mouth throughout feeding, stimulation needed to elicit sucking reflex.  Audible Swallowing: A few with stimulation  Type of Nipple: Everted at rest and after stimulation  Comfort (Breast/Nipple): Soft / non-tender     Hold (Positioning): Assistance needed to correctly position infant at breast and maintain latch.  LATCH Score: 7  Lactation Tools Discussed/Used Tools: Nipple Shields Nipple shield size: 20 WIC Program:  (notified of birth)   Consult Status Consult Status: Follow-up Date: 08/29/15 Follow-up type: Physician (barb carder)    Van Clines 08/28/2015, 11:36 AM

## 2015-08-29 ENCOUNTER — Inpatient Hospital Stay (HOSPITAL_COMMUNITY): Admission: RE | Admit: 2015-08-29 | Payer: BC Managed Care – PPO | Source: Ambulatory Visit

## 2016-04-13 DIAGNOSIS — O99345 Other mental disorders complicating the puerperium: Secondary | ICD-10-CM | POA: Diagnosis not present

## 2016-10-13 DIAGNOSIS — H47322 Drusen of optic disc, left eye: Secondary | ICD-10-CM | POA: Diagnosis not present

## 2017-01-12 DIAGNOSIS — N760 Acute vaginitis: Secondary | ICD-10-CM | POA: Diagnosis not present

## 2017-01-12 DIAGNOSIS — Z6823 Body mass index (BMI) 23.0-23.9, adult: Secondary | ICD-10-CM | POA: Diagnosis not present

## 2017-01-12 DIAGNOSIS — Z01419 Encounter for gynecological examination (general) (routine) without abnormal findings: Secondary | ICD-10-CM | POA: Diagnosis not present

## 2017-01-19 DIAGNOSIS — Z3169 Encounter for other general counseling and advice on procreation: Secondary | ICD-10-CM | POA: Diagnosis not present

## 2017-01-19 DIAGNOSIS — Z30432 Encounter for removal of intrauterine contraceptive device: Secondary | ICD-10-CM | POA: Diagnosis not present

## 2017-02-03 DIAGNOSIS — F53 Puerperal psychosis: Secondary | ICD-10-CM | POA: Diagnosis not present

## 2017-02-03 DIAGNOSIS — G4700 Insomnia, unspecified: Secondary | ICD-10-CM | POA: Diagnosis not present

## 2017-03-03 DIAGNOSIS — L74519 Primary focal hyperhidrosis, unspecified: Secondary | ICD-10-CM | POA: Diagnosis not present

## 2017-03-03 DIAGNOSIS — L218 Other seborrheic dermatitis: Secondary | ICD-10-CM | POA: Diagnosis not present

## 2017-03-30 DIAGNOSIS — Z30430 Encounter for insertion of intrauterine contraceptive device: Secondary | ICD-10-CM | POA: Diagnosis not present

## 2017-04-21 DIAGNOSIS — R635 Abnormal weight gain: Secondary | ICD-10-CM | POA: Diagnosis not present

## 2017-04-21 DIAGNOSIS — G4700 Insomnia, unspecified: Secondary | ICD-10-CM | POA: Diagnosis not present

## 2017-04-21 DIAGNOSIS — M7989 Other specified soft tissue disorders: Secondary | ICD-10-CM | POA: Diagnosis not present

## 2017-04-21 DIAGNOSIS — F53 Puerperal psychosis: Secondary | ICD-10-CM | POA: Diagnosis not present

## 2017-05-11 DIAGNOSIS — N760 Acute vaginitis: Secondary | ICD-10-CM | POA: Diagnosis not present

## 2017-05-11 DIAGNOSIS — N939 Abnormal uterine and vaginal bleeding, unspecified: Secondary | ICD-10-CM | POA: Diagnosis not present

## 2017-06-02 DIAGNOSIS — R61 Generalized hyperhidrosis: Secondary | ICD-10-CM | POA: Diagnosis not present

## 2017-06-02 DIAGNOSIS — R635 Abnormal weight gain: Secondary | ICD-10-CM | POA: Diagnosis not present

## 2017-06-02 DIAGNOSIS — F53 Puerperal psychosis: Secondary | ICD-10-CM | POA: Diagnosis not present

## 2017-06-02 DIAGNOSIS — G4700 Insomnia, unspecified: Secondary | ICD-10-CM | POA: Diagnosis not present

## 2017-06-15 DIAGNOSIS — R61 Generalized hyperhidrosis: Secondary | ICD-10-CM | POA: Diagnosis not present

## 2017-06-15 DIAGNOSIS — R635 Abnormal weight gain: Secondary | ICD-10-CM | POA: Diagnosis not present

## 2017-10-04 DIAGNOSIS — H47322 Drusen of optic disc, left eye: Secondary | ICD-10-CM | POA: Diagnosis not present

## 2017-11-03 DIAGNOSIS — N760 Acute vaginitis: Secondary | ICD-10-CM | POA: Diagnosis not present

## 2017-12-22 DIAGNOSIS — R5381 Other malaise: Secondary | ICD-10-CM | POA: Diagnosis not present

## 2018-01-13 DIAGNOSIS — Z01419 Encounter for gynecological examination (general) (routine) without abnormal findings: Secondary | ICD-10-CM | POA: Diagnosis not present

## 2018-01-13 DIAGNOSIS — Z6825 Body mass index (BMI) 25.0-25.9, adult: Secondary | ICD-10-CM | POA: Diagnosis not present

## 2018-02-03 ENCOUNTER — Ambulatory Visit (INDEPENDENT_AMBULATORY_CARE_PROVIDER_SITE_OTHER): Payer: BLUE CROSS/BLUE SHIELD | Admitting: Family Medicine

## 2018-02-03 ENCOUNTER — Encounter: Payer: Self-pay | Admitting: Family Medicine

## 2018-02-03 VITALS — BP 98/64 | HR 86 | Temp 98.5°F | Ht 64.5 in | Wt 154.0 lb

## 2018-02-03 DIAGNOSIS — G4700 Insomnia, unspecified: Secondary | ICD-10-CM | POA: Diagnosis not present

## 2018-02-03 MED ORDER — TRAZODONE HCL 50 MG PO TABS: 25.0000 mg | ORAL_TABLET | Freq: Every evening | ORAL | 3 refills | Status: DC | PRN

## 2018-02-03 NOTE — Progress Notes (Signed)
Chief Complaint  Patient presents with  . Establish Care    insomnia       New Patient Visit SUBJECTIVE: HPI: Monique KUEHNEL is an 32 y.o.female who is being seen for establishing care.  The patient was previously seen at [facility].  Insomnia This has been going on for around for 10 years.  It will take her 1-2 hrs before falling asleep, she is able to sleep for 3-4 hours, then stays away for 45-60 minutes. 2-3 awakenings per night. 5-6 interrupted hours of total sleep nightly. Does not routinely exercise. Failed Effexor, Remeron (wt gain).  She is currently on sertraline 100 mg daily for anxiety.  She does believe it controls her anxiety symptoms.  She does not sleep well at night because of racing thoughts.  She does not drink caffeine or alcohol, does not nap, does not watch television or use tablets without bluelight filters.  She has tried melatonin and is currently using Tylenol PM and Benadryl.  She does not follow with a counselor or psychologist routinely.  No family history of anxiety or depression that she is aware of.  No Known Allergies  Past Medical History:  Diagnosis Date  . GERD (gastroesophageal reflux disease)    Past Surgical History:  Procedure Laterality Date  . CESAREAN SECTION N/A 08/25/2015   Procedure: CESAREAN SECTION;  Surgeon: Dian Queen, MD;  Location: [facility];  Service: Obstetrics;  Laterality: N/A;  . NO PAST SURGERIES     Social History   Socioeconomic History  . Marital status: Married  Occupational History  . Occupation: Electrical engineer  Tobacco Use  . Smoking status: Never Smoker  . Smokeless tobacco: Never Used  Substance and Sexual Activity  . Alcohol use: No    Alcohol/week: 4.2 oz    Types: 7 Glasses of wine per week  . Drug use: No  . Sexual activity: Yes    Birth control/protection: None   Family History  Problem Relation Age of Onset  . Breast cancer Maternal Grandmother   . Neurologic Disorder Father   . Cancer Neg Hx   . Heart  disease Neg Hx    Current Outpatient Medications:  .  sertraline (ZOLOFT) 100 MG tablet, Take 100 mg by mouth daily., Disp: , Rfl:   ROS Cardiovascular: Denies palpitations  Psychiatric: As noted in HPI   OBJECTIVE: BP 98/64 (BP Location: Left Arm, Patient Position: Sitting, Cuff Size: Normal)   Pulse 86   Temp 98.5 F (36.9 C) (Oral)   Ht 5' 4.5" (1.638 m)   Wt 154 lb (69.9 kg)   SpO2 98%   BMI 26.03 kg/m   Constitutional: -  VS reviewed -  Well developed, well nourished, appears stated age -  No apparent distress  Psychiatric: -  Oriented to person, place, and time -  Memory intact -  Affect and mood normal -  Fluent conversation, good eye contact -  Judgment and insight age appropriate  Eye: -  Conjunctivae clear, no discharge -  Pupils symmetric, round, reactive to light  ENMT: -  MMM    Pharynx moist, no exudate, no erythema  Neck: -  No gross swelling, no palpable masses -  Thyroid midline, not enlarged, mobile, no palpable masses  Cardiovascular: -  RRR -  No LE edema  Respiratory: -  Normal respiratory effort, no accessory muscle use, no retraction -  Breath sounds equal, no wheezes, no ronchi, no crackles  Gastrointestinal: -  Bowel sounds normal -  No tenderness, no distention, no guarding, no masses  Neurological:  -  CN II - XII grossly intact -DTRs equal and symmetric throughout  Musculoskeletal: -  No clubbing, no cyanosis -  Gait normal  Skin: -  No significant lesion on inspection -  Warm and dry to palpation   ASSESSMENT/PLAN: Insomnia, unspecified type - Plan: traZODone (DESYREL) 50 MG tablet  Patient instructed to sign release of records form from her previous PCP. Low-dose of trazodone as needed.  Sleep hygiene recommended.  Number for cognitive behavioral therapy/behavioral health provided in the AVS.  I did offer a referral to a sleep specialist as this has been going on for over a decade.  I also suggested there are many other sleep aid  options from a pharmacologic standpoint.  If the trazodone is not helpful, would consider low-dose doxepin versus Belsomra versus changing SSRI versus adding BuSpar. Patient should return in 1 mo. The patient voiced understanding and agreement to the plan.   Lewis, DO 02/03/18  12:10 PM

## 2018-02-03 NOTE — Patient Instructions (Addendum)
Sleep is important to [place] all. Getting good sleep is imperative to adequate functioning during the day. Work with our counselors who are trained to help people obtain quality sleep. Call [phone] to schedule an appointment or if you are curious about insurance coverage/cost.  Sleep Hygiene Tips:  Do not watch TV or look at screens within 1 hour of going to bed. If you do, make sure there is a blue light filter (nighttime mode) involved.  Try to go to bed around the same time every night. Wake up at the same time within 1 hour of regular time. Ex: If you wake up at 7 AM for work, do not sleep past 8 AM on days that you don't work.  Do not drink alcohol before bedtime.  Do not consume caffeine-containing beverages after noon or within 9 hours of intended bedtime.  Get regular exercise/physical activity in your life, but not within 2 hours of planned bedtime.  Do not take naps.   Do not eat within 2 hours of planned bedtime.  Melatonin, 3-5 mg 30-60 minutes before planned bedtime may be helpful.   The bed should be for sleep or sex only. If after 20-30 minutes you are unable to fall asleep, get up and do something relaxing. Do this until you feel ready to go to sleep again.   Let us know if you need anything.

## 2018-02-03 NOTE — Progress Notes (Signed)
Pre visit review using our clinic review tool, if applicable. No additional management support is needed unless otherwise documented below in the visit note. 

## 2018-02-21 ENCOUNTER — Encounter: Payer: Self-pay | Admitting: Family Medicine

## 2018-02-21 ENCOUNTER — Ambulatory Visit: Payer: BLUE CROSS/BLUE SHIELD | Admitting: Family Medicine

## 2018-02-21 ENCOUNTER — Other Ambulatory Visit: Payer: Self-pay | Admitting: Family Medicine

## 2018-02-21 VITALS — BP 112/72 | HR 89 | Temp 98.5°F | Ht 64.5 in | Wt 153.2 lb

## 2018-02-21 DIAGNOSIS — R002 Palpitations: Secondary | ICD-10-CM | POA: Diagnosis not present

## 2018-02-21 DIAGNOSIS — F411 Generalized anxiety disorder: Secondary | ICD-10-CM

## 2018-02-21 LAB — COMPREHENSIVE METABOLIC PANEL
ALBUMIN: 4.5 g/dL (ref 3.5–5.2)
ALK PHOS: 67 U/L (ref 39–117)
ALT: 10 U/L (ref 0–35)
AST: 16 U/L (ref 0–37)
BUN: 11 mg/dL (ref 6–23)
CO2: 32 mEq/L (ref 19–32)
Calcium: 9.8 mg/dL (ref 8.4–10.5)
Chloride: 104 mEq/L (ref 96–112)
Creatinine, Ser: 0.8 mg/dL (ref 0.40–1.20)
GFR: 105.91 mL/min (ref >60.00)
Glucose, Bld: 75 mg/dL (ref 70–99)
POTASSIUM: 4.5 meq/L (ref 3.5–5.1)
Sodium: 144 mEq/L (ref 135–145)
TOTAL PROTEIN: 7.6 g/dL (ref 6.0–8.3)
Total Bilirubin: 0.7 mg/dL (ref 0.2–1.2)

## 2018-02-21 LAB — CBC WITH DIFFERENTIAL/PLATELET
BASOS PCT: 0.3 % (ref 0.0–3.0)
Basophils Absolute: 0 10*3/uL (ref 0.0–0.1)
EOS PCT: 0.9 % (ref 0.0–5.0)
Eosinophils Absolute: 0 10*3/uL (ref 0.0–0.7)
HCT: 42.8 % (ref 36.0–46.0)
HEMOGLOBIN: 14.3 g/dL (ref 12.0–15.0)
LYMPHS ABS: 1.3 10*3/uL (ref 0.7–4.0)
Lymphocytes Relative: 44.2 % (ref 12.0–46.0)
MCHC: 33.4 g/dL (ref 30.0–36.0)
MCV: 94.8 fl (ref 78.0–100.0)
MONO ABS: 0.3 10*3/uL (ref 0.1–1.0)
MONOS PCT: 9 % (ref 3.0–12.0)
NEUTROS PCT: 45.6 % (ref 43.0–77.0)
Neutro Abs: 1.3 10*3/uL — ABNORMAL LOW (ref 1.4–7.7)
Platelets: 235 10*3/uL (ref 150.0–400.0)
RBC: 4.51 Mil/uL (ref 3.87–5.11)
RDW: 13.1 % (ref 11.5–15.5)
WBC: 2.9 10*3/uL — ABNORMAL LOW (ref 4.0–10.5)

## 2018-02-21 LAB — TSH: TSH: 0.64 u[IU]/mL (ref 0.35–4.50)

## 2018-02-21 LAB — MAGNESIUM: MAGNESIUM: 2.1 mg/dL (ref 1.5–2.5)

## 2018-02-21 MED ORDER — SERTRALINE HCL 50 MG PO TABS: 50.0000 mg | ORAL_TABLET | Freq: Every day | ORAL | 1 refills | Status: DC

## 2018-02-21 NOTE — Progress Notes (Signed)
Chief Complaint  Patient presents with  . Insomnia    Trazodone did not work    Subjective Monique Pierce presents for f/u anxiety/depression.  She is currently being treated with Zoloft 100 mg/d and Trazodone 25-50 mg/qhs.  Reports no improvement with sleep since treatment. No thoughts of harming self or others. No self-medication with alcohol, prescription drugs or illicit drugs. Pt has upcoming appt with a counselor/psychologist for CBT.  Palpitations Getting more freq, lasting for several seconds. No CP or SOB. No caffeine or alcohol use. She does not have anxious thoughts and is usually lying down. No smoking or personal hx of cardiac disease.  ROS Psych: No homicidal or suicidal thoughts Heart: +palpitations  Past Medical History:  Diagnosis Date  . GERD (gastroesophageal reflux disease)    Past Surgical History:  Procedure Laterality Date  . CESAREAN SECTION N/A 08/25/2015   Procedure: CESAREAN SECTION;  Surgeon: Michelle Grewal, MD;  Location: [facility];  Service: Obstetrics;  Laterality: N/A;  . NO PAST SURGERIES     Allergies as of 02/21/2018   No Known Allergies     Medication List        Accurate as of 02/21/18 11:38 AM. Always use your most recent med list.          sertraline 100 MG tablet Commonly known as:  ZOLOFT Take 100 mg by mouth daily.   sertraline 50 MG tablet Commonly known as:  ZOLOFT Take 1 tablet (50 mg total) by mouth daily.        Exam BP 112/72 (BP Location: Left Arm, Patient Position: Sitting, Cuff Size: Normal)   Pulse 89   Temp 98.5 F (36.9 C) (Oral)   Ht 5' 4.5" (1.638 m)   Wt 153 lb 4 oz (69.5 kg)   SpO2 94%   BMI 25.90 kg/m  General:  well developed, well nourished, in no apparent distress Neck: neck supple without adenopathy, thyromegaly, or masses Lungs:  clear to auscultation, breath sounds equal bilaterally, no respiratory distress Cardio:  regular rate and rhythm without murmurs, heart sounds without clicks or  rubs Psych: well oriented with normal range of affect and age-appropriate judgement/insight, alert and oriented x4.  Assessment and Plan  Palpitations - Plan: TSH, CBC w/Diff, Comp Met (CMET), Magnesium  GAD (generalized anxiety disorder)  Orders as above. Increase dose of Zoloft from 100 mg to 150 mg, pt interested in CBD oil and has upcoming appt w CBT.  For palpitations, check labs, if nml will order Holter. F/u in 2 mo. The patient voiced understanding and agreement to the plan.  Nicholas Paul Wendling, DO 02/21/18 11:38 AM  

## 2018-02-21 NOTE — Progress Notes (Signed)
Ordering Holter monitor.

## 2018-02-21 NOTE — Progress Notes (Signed)
Pre visit review using our clinic review tool, if applicable. No additional management support is needed unless otherwise documented below in the visit note. 

## 2018-03-02 ENCOUNTER — Ambulatory Visit: Payer: BLUE CROSS/BLUE SHIELD

## 2018-03-02 DIAGNOSIS — R002 Palpitations: Secondary | ICD-10-CM

## 2018-03-03 ENCOUNTER — Ambulatory Visit: Payer: BLUE CROSS/BLUE SHIELD | Admitting: Family Medicine

## 2018-03-08 DIAGNOSIS — R002 Palpitations: Secondary | ICD-10-CM | POA: Diagnosis not present

## 2018-03-11 ENCOUNTER — Ambulatory Visit (INDEPENDENT_AMBULATORY_CARE_PROVIDER_SITE_OTHER): Payer: BLUE CROSS/BLUE SHIELD | Admitting: Psychology

## 2018-03-11 DIAGNOSIS — F4322 Adjustment disorder with anxiety: Secondary | ICD-10-CM | POA: Diagnosis not present

## 2018-03-11 DIAGNOSIS — F5101 Primary insomnia: Secondary | ICD-10-CM

## 2018-03-21 ENCOUNTER — Telehealth: Payer: Self-pay | Admitting: Family Medicine

## 2018-03-21 NOTE — Telephone Encounter (Signed)
Patient informed of PCP information. She will wait to hear from PCP.

## 2018-03-21 NOTE — Telephone Encounter (Signed)
Gwen--Dr. Nani Ravens would like to know if you know how long it should take to get this result?>

## 2018-03-21 NOTE — Telephone Encounter (Signed)
Copied from [place] [id number]. Topic: Quick Communication - Other Results >> Mar 21, 2018 11:08 AM Synthia Innocent wrote: Requesting Heart Monitor results

## 2018-03-21 NOTE — Telephone Encounter (Signed)
I see the test being completed, but a cardiologist has not read it. We will have to look into it. Could Monique Pierce help [place]? TY.

## 2018-03-23 ENCOUNTER — Ambulatory Visit (INDEPENDENT_AMBULATORY_CARE_PROVIDER_SITE_OTHER): Payer: BLUE CROSS/BLUE SHIELD | Admitting: Psychology

## 2018-03-23 DIAGNOSIS — F5101 Primary insomnia: Secondary | ICD-10-CM | POA: Diagnosis not present

## 2018-03-23 DIAGNOSIS — F4322 Adjustment disorder with anxiety: Secondary | ICD-10-CM

## 2018-03-24 ENCOUNTER — Encounter: Payer: Self-pay | Admitting: Family Medicine

## 2018-03-24 ENCOUNTER — Ambulatory Visit: Payer: BLUE CROSS/BLUE SHIELD | Admitting: Family Medicine

## 2018-03-24 VITALS — BP 112/72 | HR 96 | Temp 98.6°F | Ht 65.0 in | Wt 154.5 lb

## 2018-03-24 DIAGNOSIS — R61 Generalized hyperhidrosis: Secondary | ICD-10-CM

## 2018-03-24 MED ORDER — ALUMINUM CHLORIDE 20 % EX SOLN: Freq: Every day | CUTANEOUS | 2 refills | Status: DC

## 2018-03-24 MED ORDER — HYDROCORTISONE 2.5 % EX CREA: TOPICAL_CREAM | Freq: Two times a day (BID) | CUTANEOUS | 0 refills | Status: DC

## 2018-03-24 MED ORDER — GLYCOPYRRONIUM TOSYLATE 2.4 % EX PADS: 1.0000 "application " | MEDICATED_PAD | Freq: Every day | CUTANEOUS | 2 refills | Status: DC

## 2018-03-24 NOTE — Patient Instructions (Addendum)
Do not fill medicine if it is too expensive.   Aluminum hydroxide is available over the counter.   The cream is a contingency plan if you have irritation.  Don't use both medicines at the same time (pads/aluminum).  Let us know if you need anything.

## 2018-03-24 NOTE — Progress Notes (Signed)
Pre visit review using our clinic review tool, if applicable. No additional management support is needed unless otherwise documented below in the visit note. 

## 2018-03-24 NOTE — Progress Notes (Signed)
Chief Complaint  Patient presents with  . Excessive Sweating    Monique Pierce is a 32 y.o. female here for a skin complaint.  Duration: 10 years Location: groin and axillae She goes through several pairs of underwear daily because of this. She has tried various types of deodorant without success. She went to a dermatologist and was prescribed a medicine that she was too scared to take because she was told it would dry everything, including her eyes,.  She can not afford Botox. She has an upcoming trip and would like to get something to take care of this.  She has heard of Qbrexza in commercials.  ROS:  Endo: +sweating  Past Medical History:  Diagnosis Date  . GERD (gastroesophageal reflux disease)    BP 112/72 (BP Location: Left Arm, Patient Position: Sitting, Cuff Size: Normal)   Pulse 96   Temp 98.6 F (37 C) (Oral)   Ht 5\' 5"  (1.651 m)   Wt 154 lb 8 oz (70.1 kg)   SpO2 98%   BMI 25.71 kg/m  Gen: awake, alert, appearing stated age Lungs: No accessory muscle use Psych: Age appropriate judgment and insight  Hyperhidrosis - Plan: Glycopyrronium Tosylate (QBREXZA) 2.4 % PADS, aluminum chloride (DRYSOL) 20 % external solution, hydrocortisone 2.5 % cream  Orders as above. Try Qbrexza, if too expensive we can use Drysol nightly.  Warned of skin irritation, hydrocortisone cream called in should that arise.  If no improvement with these above therapies, will refer to dermatology for further evaluation. F/u prn. The patient voiced understanding and agreement to the plan.  [place], DO 03/24/18 3:21 PM

## 2018-03-25 ENCOUNTER — Telehealth: Payer: Self-pay | Admitting: Family Medicine

## 2018-03-25 NOTE — Telephone Encounter (Signed)
Medication Rx  Substitution  Request  Patient  Requests Drysol  20  Per cent is  Currently on back order  LOV 03/24/2018  Pharmacy   [facility]  [facility] [employer]  [address]  [facility]

## 2018-03-25 NOTE — Telephone Encounter (Signed)
Copied from [place]. Topic: General - Other >> Mar 25, 2018  9:25 AM Oneta Rack wrote:  Osvaldo Human name: Ronalee Belts  Relation to pt: Pharmacist from [place] Call back [place]: [facility] [place], [place] [phone] (Phone) [phone] (Fax)  Reason for call:  aluminum chloride (DRYSOL) 20 % external solution  currently on back order, requesting alternate, please advise

## 2018-03-28 NOTE — Telephone Encounter (Signed)
She has not gotten to the pharmacy yet with the coupon card, but will today and let PCP know if not affordable.

## 2018-03-28 NOTE — Telephone Encounter (Signed)
It was the other one called in. Aluminum hydroxide is. TY.

## 2018-03-28 NOTE — Telephone Encounter (Signed)
Is this OTC?

## 2018-03-28 NOTE — Telephone Encounter (Signed)
Is Qbrexza affordable? She could use that instead. TY.

## 2018-04-02 ENCOUNTER — Encounter: Payer: Self-pay | Admitting: Family Medicine

## 2018-04-07 ENCOUNTER — Ambulatory Visit: Payer: BLUE CROSS/BLUE SHIELD | Admitting: Psychology

## 2018-04-07 DIAGNOSIS — F4322 Adjustment disorder with anxiety: Secondary | ICD-10-CM

## 2018-04-07 DIAGNOSIS — F5101 Primary insomnia: Secondary | ICD-10-CM

## 2018-05-11 ENCOUNTER — Ambulatory Visit (INDEPENDENT_AMBULATORY_CARE_PROVIDER_SITE_OTHER): Payer: BLUE CROSS/BLUE SHIELD | Admitting: Family Medicine

## 2018-05-11 ENCOUNTER — Ambulatory Visit (INDEPENDENT_AMBULATORY_CARE_PROVIDER_SITE_OTHER): Payer: BLUE CROSS/BLUE SHIELD | Admitting: Psychology

## 2018-05-11 ENCOUNTER — Encounter: Payer: Self-pay | Admitting: Family Medicine

## 2018-05-11 VITALS — BP 102/68 | HR 93 | Temp 98.4°F | Ht 65.0 in | Wt 153.1 lb

## 2018-05-11 DIAGNOSIS — M25542 Pain in joints of left hand: Secondary | ICD-10-CM | POA: Diagnosis not present

## 2018-05-11 DIAGNOSIS — F5101 Primary insomnia: Secondary | ICD-10-CM | POA: Diagnosis not present

## 2018-05-11 DIAGNOSIS — Z8261 Family history of arthritis: Secondary | ICD-10-CM

## 2018-05-11 DIAGNOSIS — R202 Paresthesia of skin: Secondary | ICD-10-CM | POA: Diagnosis not present

## 2018-05-11 DIAGNOSIS — M25541 Pain in joints of right hand: Secondary | ICD-10-CM | POA: Diagnosis not present

## 2018-05-11 DIAGNOSIS — F4322 Adjustment disorder with anxiety: Secondary | ICD-10-CM

## 2018-05-11 LAB — SEDIMENTATION RATE: SED RATE: 15 mm/h (ref 0–20)

## 2018-05-11 LAB — VITAMIN B12: VITAMIN B 12: 780 pg/mL (ref 211–911)

## 2018-05-11 NOTE — Progress Notes (Signed)
Pre visit review using our clinic review tool, if applicable. No additional management support is needed unless otherwise documented below in the visit note. 

## 2018-05-11 NOTE — Progress Notes (Signed)
Chief Complaint  Patient presents with  . Follow-up    2 month    Subjective: Patient is a 32 y.o. female here for discussion of RA screening.  The patient has an extensive family history of rheumatoid arthritis.  She complains of bilateral proximal interphalangeal joint pain on both hands.  They are equally affected.  Some swelling intermittently.  There is been no injury or change in activity.  She feels she has had joint pain for many years, dating back to 2013.  Things have gotten worse over the past year.  She denies any weakness, balance issues, rashes or fevers.  Associated symptoms include intermittent numbness and tingling, mainly in the fourth and fifth digit and she associates it with position.  ROS: Skin: no rash MSK: As noted in HPI  Family History  Problem Relation Age of Onset  . Breast cancer Maternal Grandmother   . Neurologic Disorder Father   . Cancer Neg Hx   . Heart disease Neg Hx    Past Medical History:  Diagnosis Date  . GERD (gastroesophageal reflux disease)    No Known Allergies  Current Outpatient Medications:  .  aluminum chloride (DRYSOL) 20 % external solution, Apply topically at bedtime., Disp: 60 mL, Rfl: 2 .  Glycopyrronium Tosylate (QBREXZA) 2.4 % PADS, Apply 1 application topically daily., Disp: 30 each, Rfl: 2 .  hydrocortisone 2.5 % cream, Apply topically 2 (two) times daily., Disp: 30 g, Rfl: 0 .  sertraline (ZOLOFT) 100 MG tablet, Take 100 mg by mouth daily., Disp: , Rfl:  .  sertraline (ZOLOFT) 50 MG tablet, Take 1 tablet (50 mg total) by mouth daily., Disp: 90 tablet, Rfl: 1  Objective: BP 102/68 (BP Location: Left Arm, Patient Position: Sitting, Cuff Size: Normal)   Pulse 93   Temp 98.4 F (36.9 C) (Oral)   Ht 5\' 5"  (1.651 m)   Wt 153 lb 2 oz (69.5 kg)   SpO2 97%   BMI 25.48 kg/m  General: Awake, appears stated age MSK: +mild ttp over pip's b/l, +edema over 2nd and 3rd pip's on R, no other edema, excessive warmth or pain. No  deformity.  Neuro: Negative Tinel's, Phalen's, and reverse Phalen's Heart: Brisk capillary refill Lungs: CTAB, no rales, wheezes or rhonchi. No accessory muscle use Skin: No rashes on exposed skin Psych: Age appropriate judgment and insight, normal affect and mood  Assessment and Plan: Arthralgia of both hands - Plan: Sedimentation rate, Rheumatoid Factor, Aldolase, ANA,IFA RA Diag Pnl w/rflx Tit/Patn  Paresthesia - Plan: B12  Family history of rheumatoid arthritis  Orders as above. Given her family history, I will consider referring to rheumatology should all serologic testing come back negative. Check B12 should the paresthesia not be related to position.  I think that is most likely reason no. The patient voiced understanding and agreement to the plan.  [place], DO 05/11/18  12:14 PM

## 2018-05-11 NOTE — Patient Instructions (Signed)
Give these labs a little bit longer to come back.  Keep moving.  Let us know if you need anything.

## 2018-05-13 ENCOUNTER — Telehealth: Payer: Self-pay | Admitting: *Deleted

## 2018-05-13 DIAGNOSIS — M25542 Pain in joints of left hand: Principal | ICD-10-CM

## 2018-05-13 DIAGNOSIS — M25541 Pain in joints of right hand: Secondary | ICD-10-CM

## 2018-05-13 LAB — ANA,IFA RA DIAG PNL W/RFLX TIT/PATN: ANA: NEGATIVE

## 2018-05-13 LAB — ALDOLASE

## 2018-05-13 NOTE — Telephone Encounter (Signed)
Called and spoke with the pt and informed her that one of the blood test that was collected was to be frozen and it was not.  So Dr. Nani Ravens would like for the test to be recollected.   Pt verbalized understanding and agreed to come back and have the blood test repeated.  Pt will call back to schedule a lab appointment.  Future lab placed and sent.//AB/CMA

## 2018-05-16 ENCOUNTER — Other Ambulatory Visit: Payer: Self-pay | Admitting: Family Medicine

## 2018-05-27 ENCOUNTER — Other Ambulatory Visit (INDEPENDENT_AMBULATORY_CARE_PROVIDER_SITE_OTHER): Payer: BLUE CROSS/BLUE SHIELD

## 2018-05-27 DIAGNOSIS — M25542 Pain in joints of left hand: Secondary | ICD-10-CM

## 2018-05-27 DIAGNOSIS — M25541 Pain in joints of right hand: Secondary | ICD-10-CM

## 2018-05-30 LAB — ALDOLASE: Aldolase: 4.2 U/L (ref <=8.1)

## 2018-05-31 ENCOUNTER — Encounter: Payer: Self-pay | Admitting: Family Medicine

## 2018-06-01 ENCOUNTER — Ambulatory Visit: Payer: BLUE CROSS/BLUE SHIELD | Admitting: Psychology

## 2018-06-01 ENCOUNTER — Other Ambulatory Visit: Payer: Self-pay | Admitting: Family Medicine

## 2018-06-01 DIAGNOSIS — F4322 Adjustment disorder with anxiety: Secondary | ICD-10-CM | POA: Diagnosis not present

## 2018-06-01 DIAGNOSIS — F5101 Primary insomnia: Secondary | ICD-10-CM | POA: Diagnosis not present

## 2018-06-01 DIAGNOSIS — Z8261 Family history of arthritis: Secondary | ICD-10-CM

## 2018-06-01 DIAGNOSIS — M79642 Pain in left hand: Principal | ICD-10-CM

## 2018-06-01 DIAGNOSIS — M79641 Pain in right hand: Secondary | ICD-10-CM

## 2018-06-16 ENCOUNTER — Telehealth: Payer: Self-pay | Admitting: Family Medicine

## 2018-06-16 NOTE — Telephone Encounter (Signed)
Called patient and left voicemail to inform her that she would need to call [phone].

## 2018-06-16 NOTE — Telephone Encounter (Signed)
Our office does not have access to Terri through epic and through our office number. Patient will need to call [phone] to get into contact with Terri.

## 2018-06-16 NOTE — Telephone Encounter (Signed)
Copied from [place] [id number]. Topic: Quick Communication - See Telephone Encounter >> Jun 16, 2018 10:03 AM Rutherford Nail, NT wrote: CRM for notification. See Telephone encounter for: 06/16/18. Patient would like a call back from [place]. Declined to mention what it was regarding. Please advise. CB#: [phone]

## 2018-06-28 ENCOUNTER — Ambulatory Visit (INDEPENDENT_AMBULATORY_CARE_PROVIDER_SITE_OTHER): Payer: BLUE CROSS/BLUE SHIELD | Admitting: Psychology

## 2018-06-28 DIAGNOSIS — F4322 Adjustment disorder with anxiety: Secondary | ICD-10-CM

## 2018-07-07 DIAGNOSIS — M79642 Pain in left hand: Secondary | ICD-10-CM | POA: Diagnosis not present

## 2018-07-07 DIAGNOSIS — M79641 Pain in right hand: Secondary | ICD-10-CM | POA: Diagnosis not present

## 2018-07-19 DIAGNOSIS — M79642 Pain in left hand: Secondary | ICD-10-CM | POA: Diagnosis not present

## 2018-07-19 DIAGNOSIS — M79641 Pain in right hand: Secondary | ICD-10-CM | POA: Diagnosis not present

## 2018-07-21 ENCOUNTER — Ambulatory Visit: Payer: BLUE CROSS/BLUE SHIELD | Admitting: Psychology

## 2018-08-19 ENCOUNTER — Other Ambulatory Visit: Payer: Self-pay | Admitting: Family Medicine

## 2018-08-26 ENCOUNTER — Encounter: Payer: Self-pay | Admitting: Family Medicine

## 2018-09-09 ENCOUNTER — Encounter: Payer: Self-pay | Admitting: Nurse Practitioner

## 2018-09-09 ENCOUNTER — Ambulatory Visit: Payer: BLUE CROSS/BLUE SHIELD | Admitting: Nurse Practitioner

## 2018-09-09 VITALS — BP 100/60 | HR 77 | Temp 98.3°F | Ht 65.0 in | Wt 154.0 lb

## 2018-09-09 DIAGNOSIS — J00 Acute nasopharyngitis [common cold]: Secondary | ICD-10-CM | POA: Diagnosis not present

## 2018-09-09 LAB — POCT RAPID STREP A (OFFICE): Rapid Strep A Screen: NEGATIVE

## 2018-09-09 MED ORDER — FLUTICASONE PROPIONATE 50 MCG/ACT NA SUSP: 2.0000 | Freq: Every day | NASAL | 0 refills | Status: DC

## 2018-09-09 MED ORDER — CHLORPHEN-PE-ACETAMINOPHEN 4-10-325 MG PO TABS: 1.0000 | ORAL_TABLET | Freq: Two times a day (BID) | ORAL | 0 refills | Status: AC

## 2018-09-09 NOTE — Progress Notes (Signed)
Subjective:  Patient ID: Monique Pierce, female    DOB: 1986/03/28  Age: 32 y.o. MRN: [id number]  CC: Cough (coughing,sneezing,tingling in throat,headache,feels bad. daughter dx with strep yesterday. this has been going on for 3 days. )  Cough  This is a new problem. The current episode started in the past 7 days. The problem has been unchanged. The cough is non-productive. Associated symptoms include ear congestion, ear pain, headaches, nasal congestion, postnasal drip, rhinorrhea and a sore throat. Pertinent negatives include no chills, fever, heartburn, hemoptysis, myalgias, shortness of breath, sweats, weight loss or wheezing. She has tried OTC cough suppressant for the symptoms.   Reviewed past Medical, Social and Family history today.  Outpatient Medications Prior to Visit  Medication Sig Dispense Refill  . sertraline (ZOLOFT) 100 MG tablet Take 100 mg by mouth daily.    . sertraline (ZOLOFT) 50 MG tablet TAKE 1 TABLET BY MOUTH EVERY DAY 90 tablet 0  . aluminum chloride (DRYSOL) 20 % external solution Apply topically at bedtime. (Patient not taking: Reported on 09/09/2018) 60 mL 2  . Glycopyrronium Tosylate (QBREXZA) 2.4 % PADS Apply 1 application topically daily. (Patient not taking: Reported on 09/09/2018) 30 each 2  . hydrocortisone 2.5 % cream Apply topically 2 (two) times daily. (Patient not taking: Reported on 09/09/2018) 30 g 0   No facility-administered medications prior to visit.     ROS See HPI  Objective:  BP 100/60   Pulse 77   Temp 98.3 F (36.8 C) (Oral)   Ht 5\' 5"  (1.651 m)   Wt 154 lb (69.9 kg)   SpO2 97%   BMI 25.63 kg/m   BP Readings from Last 3 Encounters:  09/09/18 100/60  05/11/18 102/68  03/24/18 112/72    Wt Readings from Last 3 Encounters:  09/09/18 154 lb (69.9 kg)  05/11/18 153 lb 2 oz (69.5 kg)  03/24/18 154 lb 8 oz (70.1 kg)    Physical Exam  Constitutional: She is oriented to person, place, and time.  HENT:  Right Ear: Tympanic  membrane, external ear and ear canal normal.  Left Ear: Tympanic membrane, external ear and ear canal normal.  Nose: Mucosal edema and rhinorrhea present. Right sinus exhibits maxillary sinus tenderness. Right sinus exhibits no frontal sinus tenderness. Left sinus exhibits maxillary sinus tenderness. Left sinus exhibits no frontal sinus tenderness.  Mouth/Throat: Uvula is midline. No trismus in the jaw. Posterior oropharyngeal erythema present. No oropharyngeal exudate or posterior oropharyngeal edema.  Eyes: No scleral icterus.  Neck: Normal range of motion. Neck supple.  Cardiovascular: Normal rate and normal heart sounds.  Pulmonary/Chest: Effort normal and breath sounds normal.  Musculoskeletal: She exhibits no edema.  Lymphadenopathy:    She has no cervical adenopathy.  Neurological: She is alert and oriented to person, place, and time.  Vitals reviewed.   Lab Results  Component Value Date   WBC 2.9 (L) 02/21/2018   HGB 14.3 02/21/2018   HCT 42.8 02/21/2018   PLT 235.0 02/21/2018   GLUCOSE 75 02/21/2018   ALT 10 02/21/2018   AST 16 02/21/2018   NA 144 02/21/2018   K 4.5 02/21/2018   CL 104 02/21/2018   CREATININE 0.80 02/21/2018   BUN 11 02/21/2018   CO2 32 02/21/2018   TSH 0.64 02/21/2018    [place] Mfm Fetal Bpp Wo Non Stress  Result Date: 08/25/2015 OBSTETRICAL ULTRASOUND: This exam was performed within a Home Ultrasound Department. The OB US report was generated in the AS system,  and faxed to the ordering physician.  This report is available in the [facility]. See the AS Obstetric US report via the Image Link.   Assessment & Plan:   Monique Pierce was seen today for cough.  Diagnoses and all orders for this visit:  Acute nasopharyngitis -     Chlorphen-PE-Acetaminophen 4-10-325 MG TABS; Take 1 tablet by mouth every 12 (twelve) hours for 3 days. -     POCT rapid strep A -     fluticasone (FLONASE) 50 MCG/ACT nasal spray; Place 2 sprays into both nostrils  daily.   I have discontinued Yvonne Kendall. Russum's Glycopyrronium Tosylate, aluminum chloride, and hydrocortisone. I am also having her start on Chlorphen-PE-Acetaminophen and fluticasone. Additionally, I am having her maintain her sertraline and sertraline.  Meds ordered this encounter  Medications  . Chlorphen-PE-Acetaminophen 4-10-325 MG TABS    Sig: Take 1 tablet by mouth every 12 (twelve) hours for 3 days.    Dispense:  6 tablet    Refill:  0    Order Specific Question:   Supervising Provider    Answer:   MATTHEWS, CODY [[id number]]  . fluticasone (FLONASE) 50 MCG/ACT nasal spray    Sig: Place 2 sprays into both nostrils daily.    Dispense:  16 g    Refill:  0    Order Specific Question:   Supervising Provider    Answer:   MATTHEWS, CODY [[id number]]   `  Follow-up: Return if symptoms worsen or fail to improve.  Wilfred Lacy, NP

## 2018-09-09 NOTE — Patient Instructions (Signed)

## 2018-11-01 DIAGNOSIS — R82998 Other abnormal findings in urine: Secondary | ICD-10-CM | POA: Diagnosis not present

## 2018-11-01 DIAGNOSIS — N760 Acute vaginitis: Secondary | ICD-10-CM | POA: Diagnosis not present

## 2018-11-01 DIAGNOSIS — Z113 Encounter for screening for infections with a predominantly sexual mode of transmission: Secondary | ICD-10-CM | POA: Diagnosis not present

## 2018-11-01 DIAGNOSIS — R8290 Unspecified abnormal findings in urine: Secondary | ICD-10-CM | POA: Diagnosis not present

## 2018-11-07 DIAGNOSIS — H47323 Drusen of optic disc, bilateral: Secondary | ICD-10-CM | POA: Diagnosis not present

## 2018-11-11 ENCOUNTER — Emergency Department
Admission: EM | Admit: 2018-11-11 | Discharge: 2018-11-11 | Disposition: A | Discharge status: Home / Self Care | Payer: BLUE CROSS/BLUE SHIELD | Source: Home / Self Care | Attending: Family Medicine | Admitting: Family Medicine

## 2018-11-11 ENCOUNTER — Encounter: Payer: Self-pay | Admitting: Emergency Medicine

## 2018-11-11 ENCOUNTER — Other Ambulatory Visit: Payer: Self-pay

## 2018-11-11 DIAGNOSIS — J101 Influenza due to other identified influenza virus with other respiratory manifestations: Secondary | ICD-10-CM

## 2018-11-11 LAB — POCT INFLUENZA A/B
Influenza A, POC: POSITIVE — AB
Influenza B, POC: POSITIVE — AB

## 2018-11-11 MED ORDER — GUAIFENESIN-CODEINE 100-10 MG/5ML PO SOLN: ORAL | 0 refills | Status: DC

## 2018-11-11 MED ORDER — OSELTAMIVIR PHOSPHATE 75 MG PO CAPS: 75.0000 mg | ORAL_CAPSULE | Freq: Two times a day (BID) | ORAL | 0 refills | Status: DC

## 2018-11-11 NOTE — ED Provider Notes (Signed)
[facility]    CSN: [id number] Arrival date & time: 11/11/18  1553     History   Chief Complaint Chief Complaint  Patient presents with  . URI    Flu like sx's    HPI NIKKIE LIMING is a 32 y.o. female.   Complains of 2 day history flu-like illness including myalgias, headache, ?fever/chills, fatigue, and cough.  Also has mild nasal congestion and sore throat.  Cough is productive and somewhat worse at night.  No pleuritic pain or shortness of breath.  She has not had a flu shot this season.    The history is provided by the patient.    Past Medical History:  Diagnosis Date  . GERD (gastroesophageal reflux disease)     Patient Active Problem List   Diagnosis Date Noted  . GAD (generalized anxiety disorder) 02/21/2018  . Palpitations 02/21/2018  . S/P cesarean section 08/26/2015  . GERD (gastroesophageal reflux disease) 07/01/2012  . Odynophagia 07/01/2012    Past Surgical History:  Procedure Laterality Date  . CESAREAN SECTION N/A 08/25/2015   Procedure: CESAREAN SECTION;  Surgeon: Dian Queen, MD;  Location: [facility];  Service: Obstetrics;  Laterality: N/A;  . NO PAST SURGERIES      OB History    Gravida  1   Para  1   Term  1   Preterm      AB      Living  1     SAB      TAB      Ectopic      Multiple  0   Live Births  1            Home Medications    Prior to Admission medications   Medication Sig Start Date End Date Taking? Authorizing Provider  sertraline (ZOLOFT) 100 MG tablet Take 100 mg by mouth daily.   Yes [provider]  sertraline (ZOLOFT) 50 MG tablet TAKE 1 TABLET BY MOUTH EVERY DAY 08/19/18  Yes Wendling, Crosby Oyster, DO  guaiFENesin-codeine 100-10 MG/5ML syrup Take 5mL by mouth at bedtime as needed for cough.  May repeat dose in 4 to 6 hours. 11/11/18   Kandra Nicolas, MD  oseltamivir (TAMIFLU) 75 MG capsule Take 1 capsule (75 mg total) by mouth every 12 (twelve) hours. 11/11/18   Kandra Nicolas, MD    Family History Family History  Problem Relation Age of Onset  . Breast cancer Maternal Grandmother   . Neurologic Disorder Father   . Cancer Neg Hx   . Heart disease Neg Hx     Social History Social History   Tobacco Use  . Smoking status: Never Smoker  . Smokeless tobacco: Never Used  Substance Use Topics  . Alcohol use: No    Alcohol/week: 7.0 standard drinks    Types: 7 Glasses of wine per week  . Drug use: No     Allergies   Patient has no known allergies.   Review of Systems Review of Systems + sore throat + cough No pleuritic pain No wheezing + nasal congestion + post-nasal drainage No sinus pain/pressure No itchy/red eyes No earache No hemoptysis No SOB ? fever, + chills No nausea No vomiting No abdominal pain No diarrhea No urinary symptoms No skin rash + fatigue + myalgias + headache Used OTC meds without relief   Physical Exam Triage Vital Signs ED Triage Vitals  Enc Vitals Group     BP 11/11/18 1622 114/77  Pulse Rate 11/11/18 1622 86     Resp --      Temp 11/11/18 1622 99.6 F (37.6 C)     Temp Source 11/11/18 1622 Oral     SpO2 11/11/18 1622 97 %     Weight 11/11/18 1623 154 lb (69.9 kg)     Height 11/11/18 1623 5\' 4"  (1.626 m)     Head Circumference --      Peak Flow --      Pain Score 11/11/18 1623 0     Pain Loc --      Pain Edu? --      Excl. in [place]? --    No data found.  Updated Vital Signs BP 114/77 (BP Location: Left Arm)   Pulse 86   Temp 99.6 F (37.6 C) (Oral)   Ht 5\' 4"  (1.626 m)   Wt 69.9 kg   SpO2 97%   BMI 26.43 kg/m   Visual Acuity Right Eye Distance:   Left Eye Distance:   Bilateral Distance:    Right Eye Near:   Left Eye Near:    Bilateral Near:     Physical Exam Nursing notes and Vital Signs reviewed. Appearance:  Patient appears stated age, and in no acute distress Eyes:  Pupils are equal, round, and reactive to light and accomodation.  Extraocular movement is intact.   Conjunctivae are not inflamed  Ears:  Canals normal.  Tympanic membranes normal.  Nose:  Mildly congested turbinates.  No sinus tenderness.   Pharynx:  Uvula minimally swollen. Neck:  Supple.  Enlarged posterior/lateral nodes are palpated bilaterally, tender to palpation on the left.   Lungs:  Clear to auscultation.  Breath sounds are equal.  Moving air well. Heart:  Regular rate and rhythm without murmurs, rubs, or gallops.  Abdomen:  Nontender without masses or hepatosplenomegaly.  Bowel sounds are present.  No CVA or flank tenderness.  Extremities:  No edema.  Skin:  No rash present.    UC Treatments / Results  Labs (all labs ordered are listed, but only abnormal results are displayed) Labs Reviewed  POCT INFLUENZA A/B - Abnormal; Notable for the following components:      Result Value   Influenza A, POC Positive (*)    Influenza B, POC Positive (*)    All other components within normal limits    EKG None  Radiology No results found.  Procedures Procedures (including critical care time)  Medications Ordered in UC Medications - No data to display  Initial Impression / Assessment and Plan / UC Course  I have reviewed the triage vital signs and the nursing notes.  Pertinent labs & imaging results that were available during my care of the patient were reviewed by me and considered in my medical decision making (see chart for details).    Begin Tamiflu. Rx for Robitussin AC for night time cough.  Controlled Substance Prescriptions I have consulted the [place] Controlled Substances Registry for this patient, and feel the risk/benefit ratio today is favorable for proceeding with this prescription for a controlled substance.   Followup with Family Doctor if not improved in about 5 days.   Final Clinical Impressions(s) / UC Diagnoses   Final diagnoses:  Influenza A  Influenza B     Discharge Instructions      Take plain guaifenesin (1200mg  extended release tabs such as  Mucinex) twice daily, with plenty of water, for cough and congestion.  May add Pseudoephedrine (30mg , one or two every 4  to 6 hours) for sinus congestion.  Get adequate rest.   May use Afrin nasal spray (or generic oxymetazoline) each morning for about 5 days and then discontinue.  Also recommend using saline nasal spray several times daily and saline nasal irrigation (AYR is a common brand).   Try warm salt water gargles for sore throat.  Stop all antihistamines for now, and other non-prescription cough/cold preparations. May take Ibuprofen 200mg , 4 tabs every 8 hours with food for body aches, headache, etc.      ED Prescriptions    Medication Sig Dispense Auth. Provider   oseltamivir (TAMIFLU) 75 MG capsule Take 1 capsule (75 mg total) by mouth every 12 (twelve) hours. 10 capsule Kandra Nicolas, MD   guaiFENesin-codeine 100-10 MG/5ML syrup Take 32mL by mouth at bedtime as needed for cough.  May repeat dose in 4 to 6 hours. 100 mL Kandra Nicolas, MD         Kandra Nicolas, MD 11/12/18 [phone]

## 2018-11-11 NOTE — ED Triage Notes (Signed)
Here with c/o flu like sx's that started 2 days ago. Body aches, chills, cough with yellow phlegm and sore throat. Denies pain at this time.

## 2018-11-11 NOTE — Discharge Instructions (Addendum)
Take plain guaifenesin (1200mg extended release tabs such as Mucinex) twice daily, with plenty of water, for cough and congestion.  May add Pseudoephedrine (30mg, one or two every 4 to 6 hours) for sinus congestion.  Get adequate rest.   °Try warm salt water gargles for sore throat.  °Stop all antihistamines for now, and other non-prescription cough/cold preparations. °May take Ibuprofen 200mg, 4 tabs every 8 hours with food for body aches, headache, etc. °  °

## 2018-11-17 ENCOUNTER — Other Ambulatory Visit: Payer: Self-pay | Admitting: Family Medicine

## 2018-11-17 MED ORDER — SERTRALINE HCL 50 MG PO TABS: 50.0000 mg | ORAL_TABLET | Freq: Every day | ORAL | 0 refills | Status: DC

## 2018-11-18 ENCOUNTER — Other Ambulatory Visit: Payer: Self-pay | Admitting: Family Medicine

## 2018-12-12 ENCOUNTER — Encounter: Payer: Self-pay | Admitting: Family Medicine

## 2018-12-12 DIAGNOSIS — H47323 Drusen of optic disc, bilateral: Secondary | ICD-10-CM | POA: Diagnosis not present

## 2018-12-16 ENCOUNTER — Ambulatory Visit: Payer: Self-pay | Admitting: Family Medicine

## 2018-12-26 ENCOUNTER — Ambulatory Visit: Payer: Self-pay | Admitting: Family Medicine

## 2018-12-26 DIAGNOSIS — Z0289 Encounter for other administrative examinations: Secondary | ICD-10-CM

## 2019-01-17 DIAGNOSIS — N760 Acute vaginitis: Secondary | ICD-10-CM | POA: Diagnosis not present

## 2019-01-17 DIAGNOSIS — Z01419 Encounter for gynecological examination (general) (routine) without abnormal findings: Secondary | ICD-10-CM | POA: Diagnosis not present

## 2019-01-17 DIAGNOSIS — Z6825 Body mass index (BMI) 25.0-25.9, adult: Secondary | ICD-10-CM | POA: Diagnosis not present

## 2019-02-08 ENCOUNTER — Ambulatory Visit: Payer: BLUE CROSS/BLUE SHIELD | Admitting: Family Medicine

## 2019-02-08 ENCOUNTER — Encounter: Payer: Self-pay | Admitting: Family Medicine

## 2019-02-08 VITALS — BP 108/68 | HR 102 | Temp 98.9°F | Ht 64.5 in | Wt 149.4 lb

## 2019-02-08 DIAGNOSIS — R109 Unspecified abdominal pain: Secondary | ICD-10-CM | POA: Diagnosis not present

## 2019-02-08 DIAGNOSIS — F411 Generalized anxiety disorder: Secondary | ICD-10-CM | POA: Diagnosis not present

## 2019-02-08 MED ORDER — BUSPIRONE HCL 10 MG PO TABS: 10.0000 mg | ORAL_TABLET | Freq: Two times a day (BID) | ORAL | 2 refills | Status: DC

## 2019-02-08 NOTE — Patient Instructions (Addendum)
Aim to do some physical exertion for 150 minutes per week. This is typically divided into 5 days per week, 30 minutes per day. The activity should be enough to get your heart rate up. Anything is better than nothing if you have time constraints.  Heat (pad or rice pillow in microwave) over affected area, 10-15 minutes twice daily.   Stretch the area on your side daily. Hold for 30 seconds and repeat twice.   Let us know if you need anything.

## 2019-02-08 NOTE — Progress Notes (Signed)
Chief Complaint  Patient presents with  . Anxiety  . Back Pain    ON THE LEFT SIDE FOR 3 WEEKS    Subjective Monique Pierce presents for f/u anxiety/depression.  She is currently being treated with Zoloft 150 mg/d.  Reports good effect, though wearing off, since treatment. Things started to get worse around 2 mo ago. No change in life/stress that she can ID. No thoughts of harming self or others. Has been using CBD that has been helpful.  No self-medication with alcohol, prescription drugs or illicit drugs.  Pt is starting tomorrow to follow with a counselor/psychologist.  Over the past 3 weeks, the patient has had a left-sided lower side pain.  It started after she was doing some digging for her parents' yard.  No swelling or bruising, denies any neurologic signs or symptoms.  She has not tried anything at home, it is gotten no better.  ROS Psych: No homicidal or suicidal thoughts MSK: +side pain  Past Medical History:  Diagnosis Date  . GERD (gastroesophageal reflux disease)    Exam BP 108/68 (BP Location: Left Arm, Patient Position: Sitting, Cuff Size: Normal)   Pulse (!) 102   Temp 98.9 F (37.2 C) (Oral)   Ht 5' 4.5" (1.638 m)   Wt 149 lb 6 oz (67.8 kg)   SpO2 97%   BMI 25.24 kg/m  General:  well developed, well nourished, in no apparent distress Lungs:  no respiratory distress MSK: +TTP over L posterior oblique, no edema, neg straight leg Neuro: DTR's equal and symmetric in LE's, no clonus Psych: well oriented with normal range of affect and age-appropriate judgement/insight, alert and oriented x4.  Assessment and Plan  GAD (generalized anxiety disorder) - Plan: sertraline (ZOLOFT) 50 MG tablet, sertraline (ZOLOFT) 100 MG tablet, busPIRone (BUSPAR) 10 MG tablet  Side pain  Continue Zoloft 150 mg daily.  Add BuSpar 10 mg twice daily.  Counseled on exercise. Heat, Tylenol, stretch the area.  Likely oblique strain. F/u in 1 mo. The patient voiced understanding  and agreement to the plan.  [place], DO 02/08/19 12:13 PM

## 2019-02-09 DIAGNOSIS — F411 Generalized anxiety disorder: Secondary | ICD-10-CM | POA: Diagnosis not present

## 2019-02-13 DIAGNOSIS — F411 Generalized anxiety disorder: Secondary | ICD-10-CM | POA: Diagnosis not present

## 2019-02-19 ENCOUNTER — Encounter: Payer: Self-pay | Admitting: Family Medicine

## 2019-02-21 DIAGNOSIS — F411 Generalized anxiety disorder: Secondary | ICD-10-CM | POA: Diagnosis not present

## 2019-03-04 ENCOUNTER — Other Ambulatory Visit: Payer: Self-pay | Admitting: Family Medicine

## 2019-03-04 DIAGNOSIS — F411 Generalized anxiety disorder: Secondary | ICD-10-CM

## 2019-03-06 DIAGNOSIS — F411 Generalized anxiety disorder: Secondary | ICD-10-CM | POA: Diagnosis not present

## 2019-03-13 DIAGNOSIS — F411 Generalized anxiety disorder: Secondary | ICD-10-CM | POA: Diagnosis not present

## 2019-03-20 DIAGNOSIS — F411 Generalized anxiety disorder: Secondary | ICD-10-CM | POA: Diagnosis not present

## 2019-03-22 ENCOUNTER — Ambulatory Visit (INDEPENDENT_AMBULATORY_CARE_PROVIDER_SITE_OTHER): Payer: BLUE CROSS/BLUE SHIELD | Admitting: Family Medicine

## 2019-03-22 ENCOUNTER — Other Ambulatory Visit: Payer: Self-pay

## 2019-03-22 ENCOUNTER — Encounter: Payer: Self-pay | Admitting: Family Medicine

## 2019-03-22 DIAGNOSIS — F411 Generalized anxiety disorder: Secondary | ICD-10-CM

## 2019-03-22 NOTE — Progress Notes (Signed)
Chief Complaint  Patient presents with  . Follow-up    Subjective Monique Pierce presents for f/u anxiety/depression.  She is currently being treated with Zoloft 150 mg/d, BuSpar 10 mg bid was recently added.  Reports 100% improvement since treatment. No thoughts of harming self or others. Pt is following with a counselor/psychologist.  ROS Psych: No homicidal or suicidal thoughts  Past Medical History:  Diagnosis Date  . GERD (gastroesophageal reflux disease)    Exam No conversational dyspnea Age appropriate judgment and insight Nml affect and mood  Assessment and Plan  GAD (generalized anxiety disorder)  Cont BuSpar and Zoloft. Cont counseling. Request refills when needed. F/u in 6 mo for CPE. The patient voiced understanding and agreement to the plan.  [place], DO 03/22/19 10:37 AM

## 2019-03-27 DIAGNOSIS — F411 Generalized anxiety disorder: Secondary | ICD-10-CM | POA: Diagnosis not present

## 2019-04-03 DIAGNOSIS — F411 Generalized anxiety disorder: Secondary | ICD-10-CM | POA: Diagnosis not present

## 2019-04-10 DIAGNOSIS — F411 Generalized anxiety disorder: Secondary | ICD-10-CM | POA: Diagnosis not present

## 2019-04-13 ENCOUNTER — Encounter: Payer: Self-pay | Admitting: Family Medicine

## 2019-04-17 DIAGNOSIS — F411 Generalized anxiety disorder: Secondary | ICD-10-CM | POA: Diagnosis not present

## 2019-04-24 DIAGNOSIS — F411 Generalized anxiety disorder: Secondary | ICD-10-CM | POA: Diagnosis not present

## 2019-05-01 DIAGNOSIS — F411 Generalized anxiety disorder: Secondary | ICD-10-CM | POA: Diagnosis not present

## 2019-05-06 DIAGNOSIS — F411 Generalized anxiety disorder: Secondary | ICD-10-CM | POA: Diagnosis not present

## 2019-05-08 DIAGNOSIS — F411 Generalized anxiety disorder: Secondary | ICD-10-CM | POA: Diagnosis not present

## 2019-05-09 ENCOUNTER — Telehealth: Payer: Self-pay | Admitting: Family Medicine

## 2019-05-09 DIAGNOSIS — F411 Generalized anxiety disorder: Secondary | ICD-10-CM

## 2019-05-09 NOTE — Telephone Encounter (Signed)
Copied from [place] [id number]. Topic: Quick Communication - Rx Refill/Question >> May 09, 2019  2:35 PM Izola Price, Nate A wrote: Medication: busPIRone (BUSPAR) 10 MG tablet ,sertraline (ZOLOFT) 100 MG tablet ,sertraline (ZOLOFT) 50 MG tablet (Patient accidentally left medication at home and is out of town. Patient is requesting a 2 week supply of medications until she returns home.)  Has the patient contacted their pharmacy? Yes (Agent: If no, request that the patient contact the pharmacy for the refill.) (Agent: If yes, when and what did the pharmacy advise?)Contact PCP  Preferred Pharmacy (with phone number or street name): [id number] - [place], [place] - [address] [phone] (Phone) [phone] (Fax)    Agent: Please be advised that RX refills may take up to 3 business days. We ask that you follow-up with your pharmacy.

## 2019-05-10 MED ORDER — SERTRALINE HCL 50 MG PO TABS: 30.0000 mg | ORAL_TABLET | Freq: Every day | ORAL | 0 refills | Status: DC

## 2019-05-10 MED ORDER — BUSPIRONE HCL 10 MG PO TABS: 10.0000 mg | ORAL_TABLET | Freq: Two times a day (BID) | ORAL | 0 refills | Status: DC

## 2019-05-10 MED ORDER — SERTRALINE HCL 100 MG PO TABS: 100.0000 mg | ORAL_TABLET | Freq: Every day | ORAL | 0 refills | Status: DC

## 2019-05-10 NOTE — Telephone Encounter (Signed)
Sent in

## 2019-05-15 DIAGNOSIS — F411 Generalized anxiety disorder: Secondary | ICD-10-CM | POA: Diagnosis not present

## 2019-05-24 DIAGNOSIS — Z30432 Encounter for removal of intrauterine contraceptive device: Secondary | ICD-10-CM | POA: Diagnosis not present

## 2019-05-24 DIAGNOSIS — Z3040 Encounter for surveillance of contraceptives, unspecified: Secondary | ICD-10-CM | POA: Diagnosis not present

## 2019-05-29 DIAGNOSIS — F411 Generalized anxiety disorder: Secondary | ICD-10-CM | POA: Diagnosis not present

## 2019-06-01 ENCOUNTER — Other Ambulatory Visit: Payer: Self-pay | Admitting: Family Medicine

## 2019-06-01 DIAGNOSIS — F411 Generalized anxiety disorder: Secondary | ICD-10-CM

## 2019-06-01 NOTE — Telephone Encounter (Signed)
Please clarify the instructions on this refill.

## 2019-06-05 DIAGNOSIS — F411 Generalized anxiety disorder: Secondary | ICD-10-CM | POA: Diagnosis not present

## 2019-06-12 DIAGNOSIS — F411 Generalized anxiety disorder: Secondary | ICD-10-CM | POA: Diagnosis not present

## 2019-06-19 DIAGNOSIS — F411 Generalized anxiety disorder: Secondary | ICD-10-CM | POA: Diagnosis not present

## 2019-06-26 DIAGNOSIS — F411 Generalized anxiety disorder: Secondary | ICD-10-CM | POA: Diagnosis not present

## 2019-07-03 DIAGNOSIS — F411 Generalized anxiety disorder: Secondary | ICD-10-CM | POA: Diagnosis not present

## 2019-07-10 DIAGNOSIS — F411 Generalized anxiety disorder: Secondary | ICD-10-CM | POA: Diagnosis not present

## 2019-07-13 DIAGNOSIS — F411 Generalized anxiety disorder: Secondary | ICD-10-CM | POA: Diagnosis not present

## 2019-07-17 DIAGNOSIS — F411 Generalized anxiety disorder: Secondary | ICD-10-CM | POA: Diagnosis not present

## 2019-07-24 DIAGNOSIS — F411 Generalized anxiety disorder: Secondary | ICD-10-CM | POA: Diagnosis not present

## 2019-07-28 DIAGNOSIS — H10812 Pingueculitis, left eye: Secondary | ICD-10-CM | POA: Diagnosis not present

## 2019-08-03 DIAGNOSIS — Z1159 Encounter for screening for other viral diseases: Secondary | ICD-10-CM | POA: Diagnosis not present

## 2019-08-08 DIAGNOSIS — F411 Generalized anxiety disorder: Secondary | ICD-10-CM | POA: Diagnosis not present

## 2019-08-14 DIAGNOSIS — F411 Generalized anxiety disorder: Secondary | ICD-10-CM | POA: Diagnosis not present

## 2019-08-21 DIAGNOSIS — F411 Generalized anxiety disorder: Secondary | ICD-10-CM | POA: Diagnosis not present

## 2019-08-24 DIAGNOSIS — F411 Generalized anxiety disorder: Secondary | ICD-10-CM | POA: Diagnosis not present

## 2019-08-28 DIAGNOSIS — F411 Generalized anxiety disorder: Secondary | ICD-10-CM | POA: Diagnosis not present

## 2019-09-04 DIAGNOSIS — F411 Generalized anxiety disorder: Secondary | ICD-10-CM | POA: Diagnosis not present

## 2019-09-06 DIAGNOSIS — H11153 Pinguecula, bilateral: Secondary | ICD-10-CM | POA: Diagnosis not present

## 2019-09-14 DIAGNOSIS — Z30430 Encounter for insertion of intrauterine contraceptive device: Secondary | ICD-10-CM | POA: Diagnosis not present

## 2019-09-18 DIAGNOSIS — F411 Generalized anxiety disorder: Secondary | ICD-10-CM | POA: Diagnosis not present

## 2019-09-25 ENCOUNTER — Encounter: Payer: Self-pay | Admitting: Family Medicine

## 2019-09-25 DIAGNOSIS — F411 Generalized anxiety disorder: Secondary | ICD-10-CM | POA: Diagnosis not present

## 2019-09-27 ENCOUNTER — Other Ambulatory Visit: Payer: Self-pay

## 2019-09-27 ENCOUNTER — Ambulatory Visit (INDEPENDENT_AMBULATORY_CARE_PROVIDER_SITE_OTHER): Payer: BC Managed Care – PPO | Admitting: Family Medicine

## 2019-09-27 ENCOUNTER — Encounter: Payer: Self-pay | Admitting: Family Medicine

## 2019-09-27 VITALS — BP 115/70 | HR 87 | Temp 97.1°F | Resp 16 | Ht 64.5 in | Wt 152.0 lb

## 2019-09-27 DIAGNOSIS — E663 Overweight: Secondary | ICD-10-CM | POA: Diagnosis not present

## 2019-09-27 DIAGNOSIS — Z0000 Encounter for general adult medical examination without abnormal findings: Secondary | ICD-10-CM | POA: Diagnosis not present

## 2019-09-27 DIAGNOSIS — H579 Unspecified disorder of eye and adnexa: Secondary | ICD-10-CM | POA: Diagnosis not present

## 2019-09-27 NOTE — Progress Notes (Signed)
Chief Complaint  Patient presents with  . Annual Exam     Well Woman Monique Pierce is here for a complete physical.   Her last physical was >1 year ago.  Current diet: in general, a "not good" diet. Current exercise: active with child. Patient's last menstrual period was 09/12/2019. Seatbelt? Yes  Health Maintenance Pap/HPV- Yes Tetanus- Yes HIV screening- Yes   Patient has a history of a left eye complaint.  She reports hardening of the left outer portion of her eye.  She is seeing optometry and the drops that she was prescribed were not particularly helpful.  She does not regard what her condition is called.  Her vision is unchanged and she is not having any pain or itching.  She would like a second opinion.  Past Medical History:  Diagnosis Date  . GERD (gastroesophageal reflux disease)     Past Surgical History:  Procedure Laterality Date  . CESAREAN SECTION N/A 08/25/2015   Procedure: CESAREAN SECTION;  Surgeon: Dian Queen, MD;  Location: [facility];  Service: Obstetrics;  Laterality: N/A;    Medications  Current Outpatient Medications on File Prior to Visit  Medication Sig Dispense Refill  . busPIRone (BUSPAR) 10 MG tablet Take 1 tablet (10 mg total) by mouth 2 (two) times daily. 60 tablet 0  . sertraline (ZOLOFT) 100 MG tablet TAKE 1 TABLET (100 MG TOTAL) BY MOUTH DAILY. TAKE WITH 50 MG TAB FOR A TOTAL OF 150 MG DAILY. 90 tablet 1  . sertraline (ZOLOFT) 50 MG tablet Take 1 tablet (50 mg total) by mouth daily. Take with 100 mg tab for a total of 150 mg daily. 90 tablet 1    Allergies No Known Allergies  Review of Systems: Constitutional:  no unexpected weight changes Eye: As noted in HPI; no recent significant change in vision Ear/Nose/Mouth/Throat:  Ears:  no tinnitus or vertigo and no recent change in hearing Nose/Mouth/Throat:  no complaints of nasal congestion, no sore throat Cardiovascular: no chest pain Respiratory:  no cough and no shortness of  breath Gastrointestinal:  no abdominal pain, no change in bowel habits GU:  Female: negative for dysuria or pelvic pain Musculoskeletal/Extremities: + Bilateral knee pain; no pain of the joints Integumentary (Skin/Breast):  no abnormal skin lesions reported Neurologic:  no headaches Endocrine:  denies fatigue Hematologic/Lymphatic:  No areas of easy bleeding  Exam BP 115/70 (BP Location: Left Arm, Patient Position: Sitting, Cuff Size: Small)   Pulse 87   Temp (!) 97.1 F (36.2 C) (Temporal)   Resp 16   Ht 5' 4.5" (1.638 m)   Wt 152 lb (68.9 kg)   LMP 09/12/2019   SpO2 99%   BMI 25.69 kg/m  General:  well developed, well nourished, in no apparent distress Skin:  no significant moles, warts, or growths Head:  no masses, lesions, or tenderness Eyes:  pupils equal and round, sclera anicteric without sig injection; lateral scleral region with some yellowing/thickening appearance  Ears:  canals without lesions, TMs shiny without retraction, no obvious effusion, no erythema Nose:  nares patent, septum midline, mucosa normal, and no drainage or sinus tenderness Throat/Pharynx:  lips and gingiva without lesion; tongue and uvula midline; non-inflamed pharynx; no exudates or postnasal drainage Neck: neck supple without adenopathy, thyromegaly, or masses Lungs:  clear to auscultation, breath sounds equal bilaterally, no respiratory distress Cardio:  regular rate and rhythm, no bruits, no LE edema Abdomen:  abdomen soft, nontender; bowel sounds normal; no masses or organomegaly Genital: Defer  to GYN Musculoskeletal:  symmetrical muscle groups noted without atrophy or deformity Extremities:  no clubbing, cyanosis, or edema, no deformities, no skin discoloration Neuro:  gait normal; deep tendon reflexes normal and symmetric Psych: well oriented with normal range of affect and appropriate judgment/insight  Assessment and Plan  Well adult exam - Plan: CBC, Comprehensive metabolic panel, Lipid  panel  Overweight (BMI 25.0-29.9) - Plan: Amb ref to Medical Nutrition Therapy-MNT  Left eye complaint - Plan: Ambulatory referral to Ophthalmology   Well 33 y.o. female. Counseled on diet and exercise. Refer to nutritionist, recommended she check with the cost prior to going to the appointment. Refer to the ophthalmology team for second opinion regarding her left eye complaint. Other orders as above. Follow up in 1 year. The patient voiced understanding and agreement to the plan.  [place], DO 09/27/19 4:04 PM

## 2019-09-27 NOTE — Patient Instructions (Addendum)
Give [place] 2-3 business days to get the results of your labs back.   Keep the diet clean and stay active.  Aim to do some physical exertion for 150 minutes per week. This is typically divided into 5 days per week, 30 minutes per day. The activity should be enough to get your heart rate up. Anything is better than nothing if you have time constraints. Consider yoga and weight resistance exercise.  If you do not hear anything about your referrals in the next 1-2 weeks, call our office and ask for an update.  Let us know if you need anything.

## 2019-09-28 LAB — LIPID PANEL
Cholesterol: 163 mg/dL (ref 0–200)
HDL: 61.3 mg/dL (ref >39.00)
LDL Cholesterol: 81 mg/dL (ref 0–99)
NonHDL: 101.97
Total CHOL/HDL Ratio: 3
Triglycerides: 107 mg/dL (ref 0.0–149.0)
VLDL: 21.4 mg/dL (ref 0.0–40.0)

## 2019-09-28 LAB — COMPREHENSIVE METABOLIC PANEL
ALT: 11 U/L (ref 0–35)
AST: 20 U/L (ref 0–37)
Albumin: 4.3 g/dL (ref 3.5–5.2)
Alkaline Phosphatase: 67 U/L (ref 39–117)
BUN: 12 mg/dL (ref 6–23)
CO2: 27 mEq/L (ref 19–32)
Calcium: 9.3 mg/dL (ref 8.4–10.5)
Chloride: 103 mEq/L (ref 96–112)
Creatinine, Ser: 0.87 mg/dL (ref 0.40–1.20)
GFR: 89.61 mL/min (ref >60.00)
Glucose, Bld: 76 mg/dL (ref 70–99)
Potassium: 3.5 mEq/L (ref 3.5–5.1)
Sodium: 139 mEq/L (ref 135–145)
Total Bilirubin: 0.4 mg/dL (ref 0.2–1.2)
Total Protein: 6.6 g/dL (ref 6.0–8.3)

## 2019-09-28 LAB — CBC
HCT: 37.3 % (ref 36.0–46.0)
Hemoglobin: 12.6 g/dL (ref 12.0–15.0)
MCHC: 33.8 g/dL (ref 30.0–36.0)
MCV: 94.3 fl (ref 78.0–100.0)
Platelets: 217 10*3/uL (ref 150.0–400.0)
RBC: 3.96 Mil/uL (ref 3.87–5.11)
RDW: 13.2 % (ref 11.5–15.5)
WBC: 4.6 10*3/uL (ref 4.0–10.5)

## 2019-10-02 DIAGNOSIS — F411 Generalized anxiety disorder: Secondary | ICD-10-CM | POA: Diagnosis not present

## 2019-10-04 ENCOUNTER — Other Ambulatory Visit: Payer: Self-pay | Admitting: Family Medicine

## 2019-10-04 DIAGNOSIS — F411 Generalized anxiety disorder: Secondary | ICD-10-CM

## 2019-10-16 DIAGNOSIS — F411 Generalized anxiety disorder: Secondary | ICD-10-CM | POA: Diagnosis not present

## 2019-10-20 ENCOUNTER — Encounter: Payer: Self-pay | Admitting: Family Medicine

## 2019-10-30 DIAGNOSIS — Z30431 Encounter for routine checking of intrauterine contraceptive device: Secondary | ICD-10-CM | POA: Diagnosis not present

## 2019-10-30 DIAGNOSIS — N939 Abnormal uterine and vaginal bleeding, unspecified: Secondary | ICD-10-CM | POA: Diagnosis not present

## 2019-11-06 DIAGNOSIS — F411 Generalized anxiety disorder: Secondary | ICD-10-CM | POA: Diagnosis not present

## 2019-11-14 ENCOUNTER — Encounter: Payer: BC Managed Care – PPO | Attending: Family Medicine | Admitting: Registered"

## 2019-11-14 ENCOUNTER — Other Ambulatory Visit: Payer: Self-pay

## 2019-11-14 ENCOUNTER — Encounter: Payer: Self-pay | Admitting: Registered"

## 2019-11-14 DIAGNOSIS — Z713 Dietary counseling and surveillance: Secondary | ICD-10-CM

## 2019-11-14 NOTE — Progress Notes (Signed)
Medical Nutrition Therapy:  Appt start time: [id number] end time:  1500.   Assessment:  Primary concerns today: Weight management.   Pt states she has gained weight after pregnancy and would like to get back down to 130 lbs.   Pt states ~2 yrs ago took medication for sleep that increased her appetite and gained 20 lbs. Pt reports she lost 15 lbs when she stopped sleep medication and followed a diet 2 wks and reduced portions.  Pt states she does not snack much except at night and her menstrual cycle also affects her craving of sweets. Pt states she is having some difficulty in her marriage that is making it difficult for her to enjoy hobbies.  Stress: 6/10 Sleep: 12-7; use bathroom 2-3x/night Physical Activity: no structured activity. Stays busy taking care of her daughter.  Menstrual cycle: regular Temperature regulation: Pt reports she is always hot.   Preferred Learning Style:   No preference indicated   Learning Readiness:   Ready   MEDICATIONS: reviewed   DIETARY INTAKE:  Usual eating pattern includes 3 meals and 3 snacks per day.  Everyday foods include coffee.    24-hr recall:  B (11-12 ): coffee, creamer dunkin[employer] donuts extra extra, cheese toast, fruit OR oatmeal, apples OR pancakes, eggs Snk ( AM):  L (2-3 PM): [place] cheese,  OR burger & fries OR chef salad Snk ( PM): apple or tortilla chips D ( PM): chic-fil-a OR meat, veg & starch OR shrimp alfredo broccoli Snk ( PM): cookies, donuts, ice cream Beverages: coffee, water, juice, sweet tea, random soda   Usual physical activity: ADLs, no history of high physical activity  Estimated energy needs: ~1800 calories   Progress Towards Goal(s):  New goals.   Nutritional Diagnosis:  NI-5.8.5 Inadeqate fiber intake As related to fast food.  As evidenced by dietary recall.    Intervention:  Nutrition Education. Discussed factors influencing decisions in what and how much we eat. Discussed alternatives to take of our  needs rather than non-hunger eating.  Plan Exercise ideas: walking during day, dance videos, walking with daughter as she is learning to ride bike Earlier in the day find ways for self care to reduce non-hunger eating in the evenings. Rethink your drink Consider cooking more at home.   Teaching Method Utilized:  Visual Auditory  Handouts given during visit include:  none  Barriers to learning/adherence to lifestyle change: none  Demonstrated degree of understanding via:  Teach Back   Monitoring/Evaluation:  Dietary intake, exercise, and body weight in 4 week(s).

## 2019-11-14 NOTE — Patient Instructions (Signed)
Exercise ideas: walking during day, dance videos, walking with daughter as she is learning to ride bike Earlier in the day find ways for self care to reduce non-hunger eating in the evenings. Rethink your drink Consider cooking more at home.

## 2019-11-20 DIAGNOSIS — F411 Generalized anxiety disorder: Secondary | ICD-10-CM | POA: Diagnosis not present

## 2019-11-27 DIAGNOSIS — F411 Generalized anxiety disorder: Secondary | ICD-10-CM | POA: Diagnosis not present

## 2019-12-04 DIAGNOSIS — F411 Generalized anxiety disorder: Secondary | ICD-10-CM | POA: Diagnosis not present

## 2019-12-18 DIAGNOSIS — F411 Generalized anxiety disorder: Secondary | ICD-10-CM | POA: Diagnosis not present

## 2019-12-19 ENCOUNTER — Ambulatory Visit: Payer: BC Managed Care – PPO | Admitting: Registered"

## 2020-01-01 DIAGNOSIS — F411 Generalized anxiety disorder: Secondary | ICD-10-CM | POA: Diagnosis not present

## 2020-01-08 ENCOUNTER — Encounter: Payer: Self-pay | Admitting: Family Medicine

## 2020-01-15 DIAGNOSIS — F411 Generalized anxiety disorder: Secondary | ICD-10-CM | POA: Diagnosis not present

## 2020-01-29 ENCOUNTER — Ambulatory Visit: Payer: BC Managed Care – PPO | Admitting: Registered"

## 2020-01-29 DIAGNOSIS — F411 Generalized anxiety disorder: Secondary | ICD-10-CM | POA: Diagnosis not present

## 2020-01-31 ENCOUNTER — Encounter: Payer: Self-pay | Admitting: Family Medicine

## 2020-01-31 DIAGNOSIS — Z3202 Encounter for pregnancy test, result negative: Secondary | ICD-10-CM | POA: Diagnosis not present

## 2020-01-31 DIAGNOSIS — N760 Acute vaginitis: Secondary | ICD-10-CM | POA: Diagnosis not present

## 2020-01-31 DIAGNOSIS — R102 Pelvic and perineal pain: Secondary | ICD-10-CM | POA: Diagnosis not present

## 2020-01-31 DIAGNOSIS — Z113 Encounter for screening for infections with a predominantly sexual mode of transmission: Secondary | ICD-10-CM | POA: Diagnosis not present

## 2020-01-31 DIAGNOSIS — R351 Nocturia: Secondary | ICD-10-CM | POA: Diagnosis not present

## 2020-02-03 ENCOUNTER — Other Ambulatory Visit: Payer: Self-pay | Admitting: Family Medicine

## 2020-02-03 DIAGNOSIS — F411 Generalized anxiety disorder: Secondary | ICD-10-CM

## 2020-02-05 DIAGNOSIS — F411 Generalized anxiety disorder: Secondary | ICD-10-CM | POA: Diagnosis not present

## 2020-02-19 DIAGNOSIS — F411 Generalized anxiety disorder: Secondary | ICD-10-CM | POA: Diagnosis not present

## 2020-03-11 ENCOUNTER — Telehealth: Payer: Self-pay | Admitting: Family Medicine

## 2020-03-11 NOTE — Telephone Encounter (Signed)
Called and scheduled her for a Virtual Visit tomorrow 03/12/2020.

## 2020-03-11 NOTE — Telephone Encounter (Signed)
Pt states she is having itching eyes throat and nose. Whats to know what else she can take for relief .Marland Kitchen  She was taking xyal but it's no longer affective. Please advise

## 2020-03-12 ENCOUNTER — Encounter: Payer: Self-pay | Admitting: Family Medicine

## 2020-03-12 ENCOUNTER — Ambulatory Visit (INDEPENDENT_AMBULATORY_CARE_PROVIDER_SITE_OTHER): Payer: BC Managed Care – PPO | Admitting: Family Medicine

## 2020-03-12 ENCOUNTER — Other Ambulatory Visit: Payer: Self-pay

## 2020-03-12 DIAGNOSIS — J302 Other seasonal allergic rhinitis: Secondary | ICD-10-CM

## 2020-03-12 MED ORDER — FLUTICASONE PROPIONATE 50 MCG/ACT NA SUSP: 2.0000 | Freq: Every day | NASAL | 6 refills | Status: DC

## 2020-03-12 MED ORDER — LEVOCETIRIZINE DIHYDROCHLORIDE 5 MG PO TABS: 5.0000 mg | ORAL_TABLET | Freq: Every evening | ORAL | 2 refills | Status: DC

## 2020-03-12 NOTE — Progress Notes (Signed)
Chief Complaint  Patient presents with  . Allergies    itchy eyes, nose and throat    Subjective: Patient is a 34 y.o. female here for allergies. Due to COVID-19 pandemic, we are interacting via web portal for an electronic face-to-face visit. I verified patient's ID using 2 identifiers. Patient agreed to proceed with visit via this method. Patient is at home, I am at office. Patient and I are present for visit.   Taking Xyzal over past week. Still having itchy eyes, itchy nose, slightly running. No cough, sob, wheezing, ST, congestion, ear pain/drainage, sinus pain. No sick contacts. Not on INCS.  Past Medical History:  Diagnosis Date  . GERD (gastroesophageal reflux disease)     Objective: No conversational dyspnea Age appropriate judgment and insight Nml affect and mood  Assessment and Plan: Seasonal allergies - Plan: levocetirizine (XYZAL) 5 MG tablet, fluticasone (FLONASE) 50 MCG/ACT nasal spray  Orders as above. Send message in 6 weeks if no better, will start Singulair.  The patient voiced understanding and agreement to the plan.  [place], DO 03/12/20  2:42 PM

## 2020-03-19 DIAGNOSIS — F411 Generalized anxiety disorder: Secondary | ICD-10-CM | POA: Diagnosis not present

## 2020-03-27 ENCOUNTER — Other Ambulatory Visit: Payer: Self-pay | Admitting: Family Medicine

## 2020-03-27 DIAGNOSIS — F411 Generalized anxiety disorder: Secondary | ICD-10-CM

## 2020-04-03 DIAGNOSIS — F411 Generalized anxiety disorder: Secondary | ICD-10-CM | POA: Diagnosis not present

## 2020-04-15 DIAGNOSIS — F411 Generalized anxiety disorder: Secondary | ICD-10-CM | POA: Diagnosis not present

## 2020-04-22 DIAGNOSIS — F411 Generalized anxiety disorder: Secondary | ICD-10-CM | POA: Diagnosis not present

## 2020-05-06 DIAGNOSIS — F411 Generalized anxiety disorder: Secondary | ICD-10-CM | POA: Diagnosis not present

## 2020-05-07 ENCOUNTER — Telehealth: Payer: BC Managed Care – PPO | Admitting: Family Medicine

## 2020-05-08 DIAGNOSIS — H47322 Drusen of optic disc, left eye: Secondary | ICD-10-CM | POA: Diagnosis not present

## 2020-05-14 ENCOUNTER — Other Ambulatory Visit: Payer: Self-pay

## 2020-05-14 ENCOUNTER — Telehealth (INDEPENDENT_AMBULATORY_CARE_PROVIDER_SITE_OTHER): Payer: BC Managed Care – PPO | Admitting: Family Medicine

## 2020-05-14 ENCOUNTER — Encounter: Payer: Self-pay | Admitting: Family Medicine

## 2020-05-14 DIAGNOSIS — J321 Chronic frontal sinusitis: Secondary | ICD-10-CM | POA: Diagnosis not present

## 2020-05-14 MED ORDER — PREDNISONE 20 MG PO TABS: 40.0000 mg | ORAL_TABLET | Freq: Every day | ORAL | 0 refills | Status: DC

## 2020-05-14 NOTE — Progress Notes (Signed)
Chief Complaint  Patient presents with  . Cough  . Nasal Congestion    Monique Pierce here for URI complaints. Due to COVID-19 pandemic, we are interacting via web portal for an electronic face-to-face visit. I verified patient's ID using 2 identifiers. Patient agreed to proceed with visit via this method. Patient is at home, I am at office. Patient and I are present for visit.   Duration: 3 weeks  Associated symptoms: sinus congestion, sinus pain, rhinorrhea and coughing up phlegm Denies: itchy watery eyes, ear fullness, ear pain, ear drainage, sore throat, wheezing, shortness of breath, myalgia and fevers Treatment to date: None Sick contacts: No  Has started to get much better  Past Medical History:  Diagnosis Date  . GERD (gastroesophageal reflux disease)    Exam No conversational dyspnea Age appropriate judgment and insight Nml affect and mood  Frontal sinusitis, unspecified chronicity  Pred burst should it worsen. I think she is getting better naturally and would not recommend changing course.  F/u prn. If starting to experience fevers, shaking, or shortness of breath, seek immediate care. Pt voiced understanding and agreement to the plan.  [place], DO 05/14/20 1:36 PM

## 2020-05-20 DIAGNOSIS — F411 Generalized anxiety disorder: Secondary | ICD-10-CM | POA: Diagnosis not present

## 2020-06-03 DIAGNOSIS — F411 Generalized anxiety disorder: Secondary | ICD-10-CM | POA: Diagnosis not present

## 2020-06-17 DIAGNOSIS — F411 Generalized anxiety disorder: Secondary | ICD-10-CM | POA: Diagnosis not present

## 2020-07-01 DIAGNOSIS — F411 Generalized anxiety disorder: Secondary | ICD-10-CM | POA: Diagnosis not present

## 2020-07-11 ENCOUNTER — Other Ambulatory Visit: Payer: Self-pay | Admitting: Family Medicine

## 2020-07-11 DIAGNOSIS — F411 Generalized anxiety disorder: Secondary | ICD-10-CM

## 2020-07-12 ENCOUNTER — Ambulatory Visit: Payer: BC Managed Care – PPO | Admitting: Family Medicine

## 2020-07-12 ENCOUNTER — Other Ambulatory Visit: Payer: Self-pay

## 2020-07-12 ENCOUNTER — Encounter: Payer: Self-pay | Admitting: Family Medicine

## 2020-07-12 VITALS — BP 102/68 | HR 112 | Temp 99.0°F | Ht 64.5 in | Wt 159.0 lb

## 2020-07-12 DIAGNOSIS — R195 Other fecal abnormalities: Secondary | ICD-10-CM | POA: Diagnosis not present

## 2020-07-12 DIAGNOSIS — F411 Generalized anxiety disorder: Secondary | ICD-10-CM | POA: Diagnosis not present

## 2020-07-12 MED ORDER — SERTRALINE HCL 100 MG PO TABS: 100.0000 mg | ORAL_TABLET | Freq: Every day | ORAL | 1 refills | Status: DC

## 2020-07-12 NOTE — Progress Notes (Signed)
Chief Complaint  Patient presents with  . Follow-up    stool problems    Subjective: Patient is a 34 y.o. female here for mucus in stool.   6 mo of mucus in stool. Mostly stool, slight mucus around or just behind. Strains 50% of the time with associated hard and small stool.  She is not having any nausea, vomiting, bleeding, unintentional weight loss, loose stool/diarrhea, urinary complaints, fevers, or nighttime awakenings.  She tried taking Metamucil but it turned the mucus orange.  Past Medical History:  Diagnosis Date  . GERD (gastroesophageal reflux disease)     Objective: BP 102/68 (BP Location: Left Arm, Patient Position: Sitting, Cuff Size: Normal)   Pulse (!) 112   Temp 99 F (37.2 C) (Oral)   Ht 5' 4.5" (1.638 m)   Wt 159 lb (72.1 kg)   SpO2 97%   BMI 26.87 kg/m  General: Awake, appears stated age HEENT: MMM, EOMi GI: Bowel sounds present, soft, nondistended, mild tenderness to palpation in the suprapubic region, negative Rovsing's, McBurney's, Murphy's, and Carnett's Lungs: CTAB, no rales, wheezes or rhonchi. No accessory muscle use Psych: Age appropriate judgment and insight, normal affect and mood  Assessment and Plan: Mucus in stool - Plan: Stool Culture, Ova and parasite examination  GAD (generalized anxiety disorder) - Plan: sertraline (ZOLOFT) 100 MG tablet  Check stool culture.  Likely benign and she is not having any signs of colitis or IBD.  Will consider referral to the GI team if unremarkable results and she wishes to pursue further. The patient voiced understanding and agreement to the plan.  [place], DO 07/12/20  11:05 AM

## 2020-07-12 NOTE — Patient Instructions (Signed)
Give me a week to get the results of the culture back to you.  If new symptoms arise, I would like to know.   Stay hydrated.  Consider fiber gummies.   Let me know if you want to see a specialist moving forward. As of now, I don't think it is necessary.  Let us know if you need anything.

## 2020-07-15 DIAGNOSIS — F411 Generalized anxiety disorder: Secondary | ICD-10-CM | POA: Diagnosis not present

## 2020-07-17 ENCOUNTER — Other Ambulatory Visit: Payer: Self-pay | Admitting: Family Medicine

## 2020-07-17 MED ORDER — PREDNISONE 20 MG PO TABS: 40.0000 mg | ORAL_TABLET | Freq: Every day | ORAL | 0 refills | Status: AC

## 2020-07-18 LAB — OVA AND PARASITE EXAMINATION
CONCENTRATE RESULT:: NONE SEEN
MICRO NUMBER:: 10796938
SPECIMEN QUALITY:: ADEQUATE
TRICHROME RESULT:: NONE SEEN

## 2020-07-18 LAB — STOOL CULTURE
MICRO NUMBER:: 10796936
MICRO NUMBER:: 10796937
MICRO NUMBER:: 10796939
SHIGA RESULT:: NOT DETECTED
SPECIMEN QUALITY:: ADEQUATE
SPECIMEN QUALITY:: ADEQUATE
SPECIMEN QUALITY:: ADEQUATE

## 2020-08-05 DIAGNOSIS — F411 Generalized anxiety disorder: Secondary | ICD-10-CM | POA: Diagnosis not present

## 2020-08-26 DIAGNOSIS — F411 Generalized anxiety disorder: Secondary | ICD-10-CM | POA: Diagnosis not present

## 2020-09-13 DIAGNOSIS — F411 Generalized anxiety disorder: Secondary | ICD-10-CM | POA: Diagnosis not present

## 2020-09-21 ENCOUNTER — Other Ambulatory Visit: Payer: Self-pay | Admitting: Family Medicine

## 2020-09-21 DIAGNOSIS — F411 Generalized anxiety disorder: Secondary | ICD-10-CM

## 2020-09-23 DIAGNOSIS — F411 Generalized anxiety disorder: Secondary | ICD-10-CM | POA: Diagnosis not present

## 2020-09-27 ENCOUNTER — Encounter: Payer: BC Managed Care – PPO | Admitting: Family Medicine

## 2020-09-30 ENCOUNTER — Encounter: Payer: Self-pay | Admitting: Family Medicine

## 2020-09-30 ENCOUNTER — Other Ambulatory Visit: Payer: Self-pay

## 2020-09-30 ENCOUNTER — Ambulatory Visit (INDEPENDENT_AMBULATORY_CARE_PROVIDER_SITE_OTHER): Payer: BC Managed Care – PPO | Admitting: Family Medicine

## 2020-09-30 VITALS — BP 110/72 | HR 92 | Temp 99.0°F | Ht 64.5 in | Wt 158.1 lb

## 2020-09-30 DIAGNOSIS — Z1159 Encounter for screening for other viral diseases: Secondary | ICD-10-CM

## 2020-09-30 DIAGNOSIS — Z0000 Encounter for general adult medical examination without abnormal findings: Secondary | ICD-10-CM

## 2020-09-30 DIAGNOSIS — E663 Overweight: Secondary | ICD-10-CM

## 2020-09-30 NOTE — Progress Notes (Signed)
Chief Complaint  Patient presents with  . Annual Exam     Well Woman Monique Pierce is here for a complete physical.   Her last physical was >1 year ago.  Current diet: in general, diet is "up and down". Current exercise: yard work; nothing consistent.  Contraception? Yes Fatigue out of ordinary? No Seatbelt? Yes  Health Maintenance Pap/HPV- Yes Tetanus- Yes HIV screening- Yes Hep C screening- No  Past Medical History:  Diagnosis Date  . GERD (gastroesophageal reflux disease)      Past Surgical History:  Procedure Laterality Date  . CESAREAN SECTION N/A 08/25/2015   Procedure: CESAREAN SECTION;  Surgeon: Dian Queen, MD;  Location: [facility];  Service: Obstetrics;  Laterality: N/A;    Medications  Current Outpatient Medications on File Prior to Visit  Medication Sig Dispense Refill  . busPIRone (BUSPAR) 10 MG tablet TAKE 1 TABLET BY MOUTH TWICE A DAY 180 tablet 1  . fluticasone (FLONASE) 50 MCG/ACT nasal spray Place 2 sprays into both nostrils daily. 16 g 6  . levocetirizine (XYZAL) 5 MG tablet Take 1 tablet (5 mg total) by mouth every evening. 30 tablet 2  . sertraline (ZOLOFT) 100 MG tablet Take 1 tablet (100 mg total) by mouth daily. Take with 50 mg tab for a total of 150 mg daily. 90 tablet 1  . sertraline (ZOLOFT) 50 MG tablet TAKE 1 TABLET (50 MG TOTAL) BY MOUTH DAILY. TAKE WITH 100 MG TAB FOR A TOTAL OF 150 MG DAILY. 90 tablet 1   Allergies No Known Allergies  Review of Systems: Constitutional:  no unexpected weight changes Eye:  no recent significant change in vision Ear/Nose/Mouth/Throat:  Ears:  +ringing in both years, and no recent change in hearing Nose/Mouth/Throat:  no complaints of nasal congestion, no sore throat Cardiovascular: no chest pain Respiratory:  no cough and no shortness of breath Gastrointestinal:  no abdominal pain, no change in bowel habits GU:  Female: negative for dysuria or pelvic pain Musculoskeletal/Extremities:  no pain of the  joints Integumentary (Skin/Breast):  no abnormal skin lesions reported Neurologic:  no headaches Endocrine:  denies fatigue Hematologic/Lymphatic:  No areas of easy bleeding  Exam BP 110/72 (BP Location: Left Arm, Patient Position: Sitting, Cuff Size: Normal)   Pulse 92   Temp 99 F (37.2 C) (Oral)   Ht 5' 4.5" (1.638 m)   Wt 158 lb 2 oz (71.7 kg)   SpO2 97%   BMI 26.72 kg/m  General:  well developed, well nourished, in no apparent distress Skin:  no significant moles, warts, or growths Head:  no masses, lesions, or tenderness Eyes:  pupils equal and round, sclera anicteric without injection Ears:  canals without lesions, TMs shiny without retraction, no obvious effusion, no erythema Nose:  nares patent, septum midline, mucosa normal, and no drainage or sinus tenderness Throat/Pharynx:  lips and gingiva without lesion; tongue and uvula midline; non-inflamed pharynx; no exudates or postnasal drainage Neck: neck supple without adenopathy, thyromegaly, or masses Lungs:  clear to auscultation, breath sounds equal bilaterally, no respiratory distress Cardio:  regular rate and rhythm, no bruits, no LE edema Abdomen:  abdomen soft, nontender; bowel sounds normal; no masses or organomegaly Genital: Defer to GYN Musculoskeletal:  symmetrical muscle groups noted without atrophy or deformity Extremities:  no clubbing, cyanosis, or edema, no deformities, no skin discoloration Neuro:  gait normal; deep tendon reflexes normal and symmetric Psych: well oriented with normal range of affect and appropriate judgment/insight  Assessment and Plan  Well adult exam  Encounter for hepatitis C screening test for low risk patient - Plan: Hepatitis C antibody,  Overweight (BMI 25.0-29.9) - Plan: Comprehensive metabolic panel, Lipid panel  Well 34 y.o. female.  Counseled on diet and exercise. Other orders as above. Follow up in 6 mo.  The patient voiced understanding and agreement to the  plan.  Norris, DO 09/30/20 11:58 AM

## 2020-09-30 NOTE — Patient Instructions (Addendum)
Give [place] 2-3 business days to get the results of your labs back.   Keep the diet clean and stay active.   Please follow up with your gynecologist.  Let us know if you need anything.

## 2020-10-03 ENCOUNTER — Other Ambulatory Visit (INDEPENDENT_AMBULATORY_CARE_PROVIDER_SITE_OTHER): Payer: BC Managed Care – PPO

## 2020-10-03 ENCOUNTER — Other Ambulatory Visit: Payer: Self-pay

## 2020-10-03 DIAGNOSIS — Z1159 Encounter for screening for other viral diseases: Secondary | ICD-10-CM | POA: Diagnosis not present

## 2020-10-03 DIAGNOSIS — E663 Overweight: Secondary | ICD-10-CM

## 2020-10-03 NOTE — Progress Notes (Signed)
labon

## 2020-10-04 LAB — COMPREHENSIVE METABOLIC PANEL
AG Ratio: 1.8 (calc) (ref 1.0–2.5)
ALT: 9 U/L (ref 6–29)
AST: 16 U/L (ref 10–30)
Albumin: 4.4 g/dL (ref 3.6–5.1)
Alkaline phosphatase (APISO): 55 U/L (ref 31–125)
BUN: 13 mg/dL (ref 7–25)
CO2: 28 mmol/L (ref 20–32)
Calcium: 9.3 mg/dL (ref 8.6–10.2)
Chloride: 103 mmol/L (ref 98–110)
Creat: 0.82 mg/dL (ref 0.50–1.10)
Globulin: 2.4 g/dL (calc) (ref 1.9–3.7)
Glucose, Bld: 78 mg/dL (ref 65–99)
Potassium: 4.2 mmol/L (ref 3.5–5.3)
Sodium: 141 mmol/L (ref 135–146)
Total Bilirubin: 0.5 mg/dL (ref 0.2–1.2)
Total Protein: 6.8 g/dL (ref 6.1–8.1)

## 2020-10-04 LAB — LIPID PANEL
Cholesterol: 158 mg/dL (ref <200)
HDL: 64 mg/dL (ref >=50)
LDL Cholesterol (Calc): 80 mg/dL (calc)
Non-HDL Cholesterol (Calc): 94 mg/dL (calc) (ref <130)
Total CHOL/HDL Ratio: 2.5 (calc) (ref <5.0)
Triglycerides: 62 mg/dL (ref <150)

## 2020-10-04 LAB — HEPATITIS C ANTIBODY
Hepatitis C Ab: NONREACTIVE
SIGNAL TO CUT-OFF: 0.02 (ref <1.00)

## 2020-10-14 DIAGNOSIS — F411 Generalized anxiety disorder: Secondary | ICD-10-CM | POA: Diagnosis not present

## 2020-11-06 DIAGNOSIS — F411 Generalized anxiety disorder: Secondary | ICD-10-CM | POA: Diagnosis not present

## 2020-11-07 ENCOUNTER — Other Ambulatory Visit: Payer: Self-pay

## 2020-11-07 ENCOUNTER — Ambulatory Visit: Payer: BC Managed Care – PPO | Admitting: Family Medicine

## 2020-11-07 ENCOUNTER — Encounter: Payer: Self-pay | Admitting: Family Medicine

## 2020-11-07 VITALS — BP 110/64 | HR 83 | Temp 98.2°F | Wt 170.0 lb

## 2020-11-07 DIAGNOSIS — M549 Dorsalgia, unspecified: Secondary | ICD-10-CM | POA: Diagnosis not present

## 2020-11-07 DIAGNOSIS — R079 Chest pain, unspecified: Secondary | ICD-10-CM | POA: Diagnosis not present

## 2020-11-07 NOTE — Progress Notes (Signed)
Chief Complaint  Patient presents with  . Chest Pain    Monique Pierce is a 34 y.o. female here for evaluation of L upper chest pain.  Duration of issue: 1 month Quality: sharp Palliation: none Provocation: none Severity: 5-8/10 Radiation: none Duration of chest pain: 30-45 minutes Associated symptoms: none Cardiac history: none Family heart history: none No inj or change in activity. Massage did not help.  Smoker? No   Patient has a several month history of bilateral upper shoulder/neck pain and upper back pain.  No injury or change in activity.  She gets better after having massages.  Her masseuse told her that she has tight pectoral muscles.  No bruising or swelling.  Past Medical History:  Diagnosis Date  . GERD (gastroesophageal reflux disease)    Family History  Problem Relation Age of Onset  . Breast cancer Maternal Grandmother   . Neurologic Disorder Father   . Cancer Neg Hx   . Heart disease Neg Hx     BP 110/64 (BP Location: Left Arm, Patient Position: Sitting, Cuff Size: Normal)   Pulse 83   Temp 98.2 F (36.8 C) (Temporal)   Wt 170 lb (77.1 kg)   LMP 10/22/2020   SpO2 95%   BMI 28.73 kg/m  Gen: awake, alert, appears stated age 42: PERRLA, MMM Neck: No masses or asymmetry Heart: RRR, no bruits, no LE edema Lungs: CTAB, no accessory muscle use Abd: Soft, NT, ND, no masses or organomegaly MSK: chest pain is not reproducible to palpatation; TTP over b/l traps Psych: Age appropriate judgment and insight, nml mood and affect  Chest pain at rest - Plan: EKG 12-Lead  Upper back pain  EKG shows NSR, nml axis, no interval abn, no T wave changes, no ST seg elevation/depression, good R wave progression. Chest pain is atypical in nature.  Could be pleuritic pain?  Doubt PE or cardiogenic cause given duration and lack of risk factors. Stretches/exercises provided for both the pectorals and trapezius muscles.  She will send a message in 1 month if no  improvement and we will refer her to physical therapy. F/u as originally scheduled. The patient voiced understanding and agreement to the plan.  Keya Paha, DO 11/07/20 3:30 PM

## 2020-11-07 NOTE — Patient Instructions (Addendum)
Your EKG is reassuring.   OK to take Tylenol 1000 mg (2 extra strength tabs) or 975 mg (3 regular strength tabs) every 6 hours as needed.  Ibuprofen 400-600 mg (2-3 over the counter strength tabs) every 6 hours as needed for pain.  Heat (pad or rice pillow in microwave) over affected area, 10-15 minutes twice daily.   Ice/cold pack over area for 10-15 min twice daily.  Let us know if you need anything.  Pectoralis Major Rehab Ask your health care provider which exercises are safe for you. Do exercises exactly as told by your health care provider and adjust them as directed. It is normal to feel mild stretching, pulling, tightness, or discomfort as you do these exercises, but you should stop right away if you feel sudden pain or your pain gets worse.Do not begin these exercises until told by your health care provider. Stretching and range of motion exercises These exercises warm up your muscles and joints and improve the movement and flexibility of your shoulder. These exercises can also help to relieve pain, numbness, and tingling. Exercise A: Pendulum  1. Stand near a wall or a surface that you can hold onto for balance. 2. Bend at the waist and let your left / right arm hang straight down. Use your other arm to keep your balance. 3. Relax your arm and shoulder muscles, and move your hips and your trunk so your left / right arm swings freely. Your arm should swing because of the motion of your body, not because you are using your arm or shoulder muscles. 4. Keep moving so your arm swings in the following directions, as told by your health care provider: ? Side to side. ? Forward and backward. ? In clockwise and counterclockwise circles. 5. Slowly return to the starting position. Repeat 2 times. Complete this exercise 3 times per week. Exercise B: Abduction, standing 1. Stand and hold a broomstick, a cane, or a similar object. Place your hands a little more than shoulder-width apart on  the object. Your left / right hand should be palm-up, and your other hand should be palm-down. 2. While keeping your elbow straight and your shoulder muscles relaxed, push the stick across your body toward your left / right side. Raise your left / right arm to the side of your body and then over your head until you feel a stretch in your shoulder. ? Stop when you reach the angle that is recommended by your health care provider. ? Avoid shrugging your shoulder while you raise your arm. Keep your shoulder blade tucked down toward the middle of your spine. 3. Hold for 10 seconds. 4. Slowly return to the starting position. Repeat 2 times. Complete this exercise 3 times per week. Exercise C: Wand flexion, supine  1. Lie on your back. You may bend your knees for comfort. 2. Hold a broomstick, a cane, or a similar object so that your hands are about shoulder-width apart on the object. Your palms should face toward your feet. 3. Raise your left / right arm in front of your face, then behind your head (toward the floor). Use your other hand to help you do this. Stop when you feel a gentle stretch in your shoulder, or when you reach the angle that is recommended by your health care provider. 4. Hold for 3 seconds. 5. Use the broomstick and your other arm to help you return your left / right arm to the starting position. Repeat 2 times. Complete this  exercise 3 times per week. Exercise D: Wand shoulder external rotation 1. Stand and hold a broomstick, a cane, or a similar object so your handsare about shoulder-width apart on the object. 2. Start with your arms hanging down, then bend both elbows to an "L" shape (90 degrees). 3. Keep your left / right elbow at your side. Use your other hand to push the stick so your left / right forearm moves away from your body, out to your side. ? Keep your left / right elbow bent to 90 degrees and keep it against your side. ? Stop when you feel a gentle stretch in your  shoulder, or when you reach the angle recommended by your health care provider. 4. Hold for 10 seconds. 5. Use the stick to help you return your left / right arm to the starting position. Repeat 2 times. Complete this exercise 3 times per week. Strengthening exercises These exercises build strength and endurance in your shoulder. Endurance is the ability to use your muscles for a long time, even after your muscles get tired. Exercise E: Scapular protraction, standing 1. Stand so you are facing a wall. Place your feet about one arm-length away from the wall. 2. Place your hands on the wall and straighten your elbows. 3. Keep your hands on the wall as you push your upper back away from the wall. You should feel your shoulder blades sliding forward.Keep your elbows and your head still. ? If you are not sure that you are doing this exercise correctly, ask your health care provider for more instructions. 4. Hold for 3 seconds. 5. Slowly return to the starting position. Let your muscles relax completely before you repeat this exercise. Repeat 2 times. Complete this exercise 3 times per week. Exercise F: Shoulder blade squeezes  (scapular retraction) 1. Sit with good posture in a stable chair. Do not let your back touch the back of the chair. 2. Your arms should be at your sides with your elbows bent. You may rest your forearms on a pillow if that is more comfortable. 3. Squeeze your shoulder blades together. Bring them down and back. ? Keep your shoulders level. ? Do not lift your shoulders up toward your ears. 4. Hold for 3 seconds. 5. Return to the starting position. Repeat 2 times. Complete this exercise 3 times per week. This information is not intended to replace advice given to you by your health care provider. Make sure you discuss any questions you have with your health care provider. Document Released: 11/23/2005 Document Revised: 09/03/2016 Document Reviewed: 08/11/2015 Elsevier  Interactive Patient Education  2018 [employer].  Trapezius stretches/exercises Do exercises exactly as told by your health care provider and adjust them as directed. It is normal to feel mild stretching, pulling, tightness, or discomfort as you do these exercises, but you should stop right away if you feel sudden pain or your pain gets worse.  Stretching and range of motion exercises These exercises warm up your muscles and joints and improve the movement and flexibility of your shoulder. These exercises can also help to relieve pain, numbness, and tingling. If you are unable to do any of the following for any reason, do not further attempt to do it.   Exercise A: Flexion, standing    1. Stand and hold a broomstick, a cane, or a similar object. Place your hands a little more than shoulder-width apart on the object. Your left / right hand should be palm-up, and your other hand should  be palm-down. 2. Push the stick to raise your left / right arm out to your side and then over your head. Use your other hand to help move the stick. Stop when you feel a stretch in your shoulder, or when you reach the angle that is recommended by your health care provider. ? Avoid shrugging your shoulder while you raise your arm. Keep your shoulder blade tucked down toward your spine. 3. Hold for 30 seconds. 4. Slowly return to the starting position. Repeat 2 times. Complete this exercise 3 times per week.  Exercise B: Abduction, supine    1. Lie on your back and hold a broomstick, a cane, or a similar object. Place your hands a little more than shoulder-width apart on the object. Your left / right hand should be palm-up, and your other hand should be palm-down. 2. Push the stick to raise your left / right arm out to your side and then over your head. Use your other hand to help move the stick. Stop when you feel a stretch in your shoulder, or when you reach the angle that is recommended by your health care  provider. ? Avoid shrugging your shoulder while you raise your arm. Keep your shoulder blade tucked down toward your spine. 3. Hold for 30 seconds. 4. Slowly return to the starting position. Repeat 2 times. Complete this exercise 3 times per week.  Exercise C: Flexion, active-assisted    1. Lie on your back. You may bend your knees for comfort. 2. Hold a broomstick, a cane, or a similar object. Place your hands about shoulder-width apart on the object. Your palms should face toward your feet. 3. Raise the stick and move your arms over your head and behind your head, toward the floor. Use your healthy arm to help your left / right arm move farther. Stop when you feel a gentle stretch in your shoulder, or when you reach the angle where your health care provider tells you to stop. 4. Hold for 30 seconds. 5. Slowly return to the starting position. Repeat 2 times. Complete this exercise 3 times per week.  Exercise D: External rotation and abduction    1. Stand in a door frame with one of your feet slightly in front of the other. This is called a staggered stance. 2. Choose one of the following positions as told by your health care provider: ? Place your hands and forearms on the door frame above your head. ? Place your hands and forearms on the door frame at the height of your head. ? Place your hands on the door frame at the height of your elbows. 3. Slowly move your weight onto your front foot until you feel a stretch across your chest and in the front of your shoulders. Keep your head and chest upright and keep your abdominal muscles tight. 4. Hold for 30 seconds. 5. To release the stretch, shift your weight to your back foot. Repeat 2 times. Complete this stretch 3 times per week.  Strengthening exercises These exercises build strength and endurance in your shoulder. Endurance is the ability to use your muscles for a long time, even after your muscles get tired. Exercise E: Scapular  depression and adduction  1. Sit on a stable chair. Support your arms in front of you with pillows, armrests, or a tabletop. Keep your elbows in line with the sides of your body. 2. Gently move your shoulder blades down toward your middle back. Relax the muscles on the  tops of your shoulders and in the back of your neck. 3. Hold for 3 seconds. 4. Slowly release the tension and relax your muscles completely before doing this exercise again. Repeat for a total of 10 repetitions. 5. After you have practiced this exercise, try doing the exercise without the arm support. Then, try the exercise while standing instead of sitting. Repeat 2 times. Complete this exercise 3 times per week.  Exercise F: Shoulder abduction, isometric    1. Stand or sit about 4-6 inches (10-15 cm) from a wall with your left / right side facing the wall. 2. Bend your left / right elbow and gently press your elbow against the wall. 3. Increase the pressure slowly until you are pressing as hard as you can without shrugging your shoulder. 4. Hold for 3 seconds. 5. Slowly release the tension and relax your muscles completely. Repeat for a total of 10 repetitions. Repeat 2 times. Complete this exercise 3 times per week.  Exercise G: Shoulder flexion, isometric    1. Stand or sit about 4-6 inches (10-15 cm) away from a wall with your left / right side facing the wall. 2. Keep your left / right elbow straight and gently press the top of your fist against the wall. Increase the pressure slowly until you are pressing as hard as you can without shrugging your shoulder. 3. Hold for 10-15 seconds. 4. Slowly release the tension and relax your muscles completely. Repeat for a total of 10 repetitions. Repeat 2 times. Complete this exercise 3 times per week.  Exercise H: Internal rotation    1. Sit in a stable chair without armrests, or stand. Secure an exercise band at your left / right side, at elbow height. 2. Place a soft  object, such as a folded towel or a small pillow, under your left / right upper arm so your elbow is a few inches (about 8 cm) away from your side. 3. Hold the end of the exercise band so the band stretches. 4. Keeping your elbow pressed against the soft object under your arm, move your forearm across your body toward your abdomen. Keep your body steady so the movement is only coming from your shoulder. 5. Hold for 3 seconds. 6. Slowly return to the starting position. Repeat for a total of 10 repetitions. Repeat 2 times. Complete this exercise 3 times per week.  Exercise I: External rotation    1. Sit in a stable chair without armrests, or stand. 2. Secure an exercise band at your left / right side, at elbow height. 3. Place a soft object, such as a folded towel or a small pillow, under your left / right upper arm so your elbow is a few inches (about 8 cm) away from your side. 4. Hold the end of the exercise band so the band stretches. 5. Keeping your elbow pressed against the soft object under your arm, move your forearm out, away from your abdomen. Keep your body steady so the movement is only coming from your shoulder. 6. Hold for 3 seconds. 7. Slowly return to the starting position. Repeat for a total of 10 repetitions. Repeat 2 times. Complete this exercise 3 times per week. Exercise J: Shoulder extension  1. Sit in a stable chair without armrests, or stand. Secure an exercise band to a stable object in front of you so the band is at shoulder height. 2. Hold one end of the exercise band in each hand. Your palms should face each other.  3. Straighten your elbows and lift your hands up to shoulder height. 4. Step back, away from the secured end of the exercise band, until the band stretches. 5. Squeeze your shoulder blades together and pull your hands down to the sides of your thighs. Stop when your hands are straight down by your sides. Do not let your hands go behind your body. 6. Hold  for 3 seconds. 7. Slowly return to the starting position. Repeat for a total of 10 repetitions. Repeat 2 times. Complete this exercise 3 times per week.  Exercise K: Shoulder extension, prone    1. Lie on your abdomen on a firm surface so your left / right arm hangs over the edge. 2. Hold a 5 lb weight in your hand so your palm faces in toward your body. Your arm should be straight. 3. Squeeze your shoulder blade down toward the middle of your back. 4. Slowly raise your arm behind you, up to the height of the surface that you are lying on. Keep your arm straight. 5. Hold for 3 seconds. 6. Slowly return to the starting position and relax your muscles. Repeat for a total of 10 repetitions. Repeat 2 times. Complete this exercise 3 times per week.   Exercise L: Horizontal abduction, prone  1. Lie on your abdomen on a firm surface so your left / right arm hangs over the edge. 2. Hold a 5 lb weight in your hand so your palm faces toward your feet. Your arm should be straight. 3. Squeeze your shoulder blade down toward the middle of your back. 4. Bend your elbow so your hand moves up, until your elbow is bent to an "L" shape (90 degrees). With your elbow bent, slowly move your forearm forward and up. Raise your hand up to the height of the surface that you are lying on. ? Your upper arm should not move, and your elbow should stay bent. ? At the top of the movement, your palm should face the floor. 5. Hold for 3 seconds. 6. Slowly return to the starting position and relax your muscles. Repeat for a total of 10 repetitions. Repeat 2 times. Complete this exercise 3 times per week.  Exercise M: Horizontal abduction, standing  1. Sit on a stable chair, or stand. 2. Secure an exercise band to a stable object in front of you so the band is at shoulder height. 3. Hold one end of the exercise band in each hand. 4. Straighten your elbows and lift your hands straight in front of you, up to shoulder  height. Your palms should face down, toward the floor. 5. Step back, away from the secured end of the exercise band, until the band stretches. 6. Move your arms out to your sides, and keep your arms straight. 7. Hold for 3 seconds. 8. Slowly return to the starting position. Repeat for a total of 10 repetitions. Repeat 2 times. Complete this exercise 3 times per week.  Exercise N: Scapular retraction and elevation  1. Sit on a stable chair, or stand. 2. Secure an exercise band to a stable object in front of you so the band is at shoulder height. 3. Hold one end of the exercise band in each hand. Your palms should face each other. 4. Sit in a stable chair without armrests, or stand. 5. Step back, away from the secured end of the exercise band, until the band stretches. 6. Squeeze your shoulder blades together and lift your hands over your head. Keep  your elbows straight. 7. Hold for 3 seconds. 8. Slowly return to the starting position. Repeat for a total of 10 repetitions. Repeat 2 times. Complete this exercise 3 times per week.  This information is not intended to replace advice given to you by your health care provider. Make sure you discuss any questions you have with your health care provider. Document Released: 11/23/2005 Document Revised: 07/30/2016 Document Reviewed: 10/10/2015 Elsevier Interactive Patient Education  2017 [employer].

## 2020-11-25 DIAGNOSIS — F411 Generalized anxiety disorder: Secondary | ICD-10-CM | POA: Diagnosis not present

## 2020-12-30 ENCOUNTER — Other Ambulatory Visit: Payer: Self-pay | Admitting: Family Medicine

## 2020-12-30 DIAGNOSIS — F411 Generalized anxiety disorder: Secondary | ICD-10-CM | POA: Diagnosis not present

## 2021-01-27 DIAGNOSIS — F411 Generalized anxiety disorder: Secondary | ICD-10-CM | POA: Diagnosis not present

## 2021-02-24 ENCOUNTER — Telehealth (INDEPENDENT_AMBULATORY_CARE_PROVIDER_SITE_OTHER): Payer: BC Managed Care – PPO | Admitting: Family Medicine

## 2021-02-24 ENCOUNTER — Encounter: Payer: Self-pay | Admitting: Family Medicine

## 2021-02-24 DIAGNOSIS — F411 Generalized anxiety disorder: Secondary | ICD-10-CM

## 2021-02-24 DIAGNOSIS — F330 Major depressive disorder, recurrent, mild: Secondary | ICD-10-CM

## 2021-02-24 MED ORDER — BUPROPION HCL ER (XL) 150 MG PO TB24: 150.0000 mg | ORAL_TABLET | Freq: Every day | ORAL | 2 refills | Status: DC

## 2021-02-24 NOTE — Progress Notes (Signed)
Chief Complaint  Patient presents with  . Medication Problem    Subjective Monique Pierce presents for f/u anxiety/depression. Due to COVID-19 pandemic, we are interacting via web portal for an electronic face-to-face visit. I verified patient's ID using 2 identifiers. Patient agreed to proceed with visit via this method. Patient is at home, I am at office. Patient and I are present for visit.   Pt is currently being treated with Zoloft 150 mg/d, BuSpar 10 mg bid.  Reports doing OK since treatment. Feels depression. Young child serves as a Nurse, children's.  No thoughts of harming self or others. No self-medication with alcohol, prescription drugs or illicit drugs. Riding bikes for exercise.  Failed Effexor in the past 2/2 HA's and increased appetite.  Pt is following with a counselor/psychologist.  Past Medical History:  Diagnosis Date  . GERD (gastroesophageal reflux disease)    Allergies as of 02/24/2021   No Known Allergies     Medication List       Accurate as of February 24, 2021  2:45 PM. If you have any questions, ask your nurse or doctor.        buPROPion 150 MG 24 hr tablet Commonly known as: Wellbutrin XL Take 1 tablet (150 mg total) by mouth daily. Started by: Shelda Pal, DO   busPIRone 10 MG tablet Commonly known as: BUSPAR TAKE 1 TABLET BY MOUTH TWICE A DAY   fluticasone 50 MCG/ACT nasal spray Commonly known as: FLONASE Place 2 sprays into both nostrils daily.   levocetirizine 5 MG tablet Commonly known as: XYZAL Take 1 tablet (5 mg total) by mouth every evening.   sertraline 100 MG tablet Commonly known as: ZOLOFT Take 1 tablet (100 mg total) by mouth daily. Take with 50 mg tab for a total of 150 mg daily.   sertraline 50 MG tablet Commonly known as: ZOLOFT TAKE 1 TABLET (50 MG TOTAL) BY MOUTH DAILY. TAKE WITH 100 MG TAB FOR A TOTAL OF 150 MG DAILY.       Exam No conversational dyspnea Age appropriate judgment and insight Nml affect and  mood  Assessment and Plan  MDD (major depressive disorder), recurrent episode, mild ([place]) - Plan: buPROPion (WELLBUTRIN XL) 150 MG 24 hr tablet  GAD (generalized anxiety disorder)  Status: chronic, uncontrolled. Add Wellbutrin XL 150 mg/d. Cont Zoloft 150 mg/d, BuSpar 10 mg bid. Cont w counseling.  F/u in 1 mo. The patient voiced understanding and agreement to the plan.  [place], DO 02/24/21 2:45 PM

## 2021-03-11 DIAGNOSIS — N760 Acute vaginitis: Secondary | ICD-10-CM | POA: Diagnosis not present

## 2021-03-11 DIAGNOSIS — Z30431 Encounter for routine checking of intrauterine contraceptive device: Secondary | ICD-10-CM | POA: Diagnosis not present

## 2021-03-11 DIAGNOSIS — N898 Other specified noninflammatory disorders of vagina: Secondary | ICD-10-CM | POA: Diagnosis not present

## 2021-03-11 DIAGNOSIS — Z6827 Body mass index (BMI) 27.0-27.9, adult: Secondary | ICD-10-CM | POA: Diagnosis not present

## 2021-03-11 DIAGNOSIS — Z01419 Encounter for gynecological examination (general) (routine) without abnormal findings: Secondary | ICD-10-CM | POA: Diagnosis not present

## 2021-03-23 ENCOUNTER — Other Ambulatory Visit: Payer: Self-pay | Admitting: Family Medicine

## 2021-03-23 DIAGNOSIS — F330 Major depressive disorder, recurrent, mild: Secondary | ICD-10-CM

## 2021-03-24 DIAGNOSIS — F411 Generalized anxiety disorder: Secondary | ICD-10-CM | POA: Diagnosis not present

## 2021-03-25 ENCOUNTER — Other Ambulatory Visit: Payer: Self-pay | Admitting: Family Medicine

## 2021-03-25 DIAGNOSIS — F411 Generalized anxiety disorder: Secondary | ICD-10-CM

## 2021-03-31 ENCOUNTER — Ambulatory Visit: Payer: BC Managed Care – PPO | Admitting: Family Medicine

## 2021-03-31 ENCOUNTER — Encounter: Payer: Self-pay | Admitting: Family Medicine

## 2021-03-31 ENCOUNTER — Other Ambulatory Visit: Payer: Self-pay

## 2021-03-31 VITALS — BP 108/72 | HR 78 | Temp 98.9°F | Ht 64.0 in | Wt 162.4 lb

## 2021-03-31 DIAGNOSIS — F411 Generalized anxiety disorder: Secondary | ICD-10-CM | POA: Diagnosis not present

## 2021-03-31 DIAGNOSIS — F330 Major depressive disorder, recurrent, mild: Secondary | ICD-10-CM | POA: Diagnosis not present

## 2021-03-31 NOTE — Progress Notes (Signed)
Chief Complaint  Patient presents with  . Follow-up    Subjective Monique Pierce presents for f/u anxiety/depression.  Pt is currently being treated with Zoloft 150 mg/d, Wellbutrin XL 150 mg/d, BuSpar 10 mg bid.  Reports doing well since adding Wellbutrin. No thoughts of harming others. She does get intermittent and random thoughts of harming herself. She does not have a plan or access to deadly weapons. She knows she is both needed and wanted by loved ones/friends and has no true desire to harm herself.  No self-medication with alcohol, prescription drugs or illicit drugs. Pt does follow with a counselor/psychologist, but not routinely anymore.  Past Medical History:  Diagnosis Date  . GERD (gastroesophageal reflux disease)    Allergies as of 03/31/2021   No Known Allergies     Medication List       Accurate as of March 31, 2021  9:45 AM. If you have any questions, ask your nurse or doctor.        buPROPion 150 MG 24 hr tablet Commonly known as: WELLBUTRIN XL TAKE 1 TABLET BY MOUTH EVERY DAY   busPIRone 10 MG tablet Commonly known as: BUSPAR TAKE 1 TABLET BY MOUTH TWICE A DAY   fluticasone 50 MCG/ACT nasal spray Commonly known as: FLONASE Place 2 sprays into both nostrils daily.   levocetirizine 5 MG tablet Commonly known as: XYZAL Take 1 tablet (5 mg total) by mouth every evening.   sertraline 100 MG tablet Commonly known as: ZOLOFT Take 1 tablet (100 mg total) by mouth daily. Take with 50 mg tab for a total of 150 mg daily.   sertraline 50 MG tablet Commonly known as: ZOLOFT TAKE 1 TABLET (50 MG TOTAL) BY MOUTH DAILY. TAKE WITH 100 MG TAB FOR A TOTAL OF 150 MG DAILY.       Exam BP 108/72 (BP Location: Left Arm, Patient Position: Sitting, Cuff Size: Normal)   Pulse 78   Temp 98.9 F (37.2 C) (Oral)   Ht 5\' 4"  (1.626 m)   Wt 162 lb 6 oz (73.7 kg)   SpO2 98%   BMI 27.87 kg/m  General:  well developed, well nourished, in no apparent distress Heart:  RRR Lungs:  CTAB. No respiratory distress Psych: well oriented with normal range of affect and age-appropriate judgement/insight, alert and oriented x4.  Assessment and Plan  MDD (major depressive disorder), recurrent episode, mild (HCC), Chronic  GAD (generalized anxiety disorder)  Cont Zoloft 150 mg/d, BuSpar 10 mg bid, Wellbutrin XL 150 mg/d. Discussed suicidal thoughts. I think she is rational about it. Would not alter meds. She has a good support system overall and has no attempts or strong urges. Counseled on exercise.  F/u in 6 mo for CPE or prn. The patient voiced understanding and agreement to the plan.  [place], DO 03/31/21 9:45 AM

## 2021-03-31 NOTE — Patient Instructions (Signed)
Aim to do some physical exertion for 150 minutes per week. This is typically divided into 5 days per week, 30 minutes per day. The activity should be enough to get your heart rate up. Anything is better than nothing if you have time constraints.  Let us know if you need anything.  

## 2021-05-26 ENCOUNTER — Encounter: Payer: Self-pay | Admitting: Obstetrics & Gynecology

## 2021-05-26 ENCOUNTER — Other Ambulatory Visit: Payer: Self-pay

## 2021-05-26 ENCOUNTER — Encounter: Payer: Self-pay | Admitting: General Practice

## 2021-05-28 NOTE — Progress Notes (Signed)
Pt was not seen.

## 2021-06-23 ENCOUNTER — Other Ambulatory Visit: Payer: Self-pay | Admitting: Family Medicine

## 2021-06-23 ENCOUNTER — Encounter: Payer: Self-pay | Admitting: General Practice

## 2021-06-23 DIAGNOSIS — F411 Generalized anxiety disorder: Secondary | ICD-10-CM

## 2021-06-25 ENCOUNTER — Encounter: Payer: Self-pay | Admitting: Obstetrics & Gynecology

## 2021-07-17 ENCOUNTER — Other Ambulatory Visit: Payer: Self-pay | Admitting: Family Medicine

## 2021-07-17 DIAGNOSIS — F411 Generalized anxiety disorder: Secondary | ICD-10-CM

## 2021-08-18 ENCOUNTER — Encounter: Payer: Self-pay | Admitting: Obstetrics & Gynecology

## 2021-08-18 ENCOUNTER — Ambulatory Visit: Payer: 59 | Admitting: Obstetrics & Gynecology

## 2021-08-18 ENCOUNTER — Other Ambulatory Visit: Payer: Self-pay

## 2021-08-18 VITALS — BP 114/75 | HR 75 | Wt 156.0 lb

## 2021-08-18 DIAGNOSIS — R87610 Atypical squamous cells of undetermined significance on cytologic smear of cervix (ASC-US): Secondary | ICD-10-CM

## 2021-08-20 ENCOUNTER — Encounter: Payer: Self-pay | Admitting: Obstetrics & Gynecology

## 2021-08-20 NOTE — Progress Notes (Signed)
History:  35 y.o. G1P1001 here today for 2nd opinion of abnormal PAP. Had PAP done at [facility]. Received a letter that it was 'abnormal and she needed to follow up'.    The following portions of the patient's history were reviewed and updated as appropriate: allergies, current medications, past family history, past medical history, past social history, past surgical history and problem list.  Review of Systems:  Pertinent items are noted in HPI.    Objective:  Physical Exam Blood pressure 114/75, pulse 75, weight 156 lb (70.8 kg), unknown if currently breastfeeding.  CONSTITUTIONAL: Well-developed, well-nourished female in no acute distress.  HENT:  Normocephalic, atraumatic EYES: Conjunctivae and EOM are normal. No scleral icterus.  NECK: Normal range of motion SKIN: Skin is warm and dry. No rash noted. Not diaphoretic.No pallor. [place]: Alert and oriented to person, place, and time. Normal coordination.    Labs and Imaging 03/11/2021 ASCUS neg hrHPV  Assessment & Plan:  I reviewed the results of the PAP and the screening protocols for cervical cancer.  All questions answered.  Rec f/u in 03/2022 for repeat PAP.  Reviewed records from [facility]  Total face-to-face time with patient, review of chart, review of prev records and coordination of care was 66mn.     [place] Schorr L. Harraway-Smith, M.D., FVergennes Harraway-Smith, M.D., FCherlynn June

## 2021-08-21 ENCOUNTER — Other Ambulatory Visit: Payer: Self-pay | Admitting: Family Medicine

## 2021-08-21 DIAGNOSIS — F411 Generalized anxiety disorder: Secondary | ICD-10-CM

## 2021-08-30 ENCOUNTER — Other Ambulatory Visit: Payer: Self-pay | Admitting: Family Medicine

## 2021-08-30 DIAGNOSIS — F411 Generalized anxiety disorder: Secondary | ICD-10-CM

## 2021-09-29 ENCOUNTER — Other Ambulatory Visit: Payer: Self-pay | Admitting: Family Medicine

## 2021-09-29 DIAGNOSIS — F330 Major depressive disorder, recurrent, mild: Secondary | ICD-10-CM

## 2021-09-30 ENCOUNTER — Encounter: Payer: BC Managed Care – PPO | Admitting: Family Medicine

## 2021-10-27 ENCOUNTER — Encounter: Payer: 59 | Admitting: Family Medicine

## 2021-11-24 ENCOUNTER — Ambulatory Visit (INDEPENDENT_AMBULATORY_CARE_PROVIDER_SITE_OTHER): Payer: 59 | Admitting: Family Medicine

## 2021-11-24 ENCOUNTER — Encounter: Payer: Self-pay | Admitting: Family Medicine

## 2021-11-24 VITALS — BP 100/72 | HR 100 | Temp 98.9°F | Ht 64.5 in | Wt 162.1 lb

## 2021-11-24 DIAGNOSIS — J029 Acute pharyngitis, unspecified: Secondary | ICD-10-CM | POA: Diagnosis not present

## 2021-11-24 DIAGNOSIS — Z0000 Encounter for general adult medical examination without abnormal findings: Secondary | ICD-10-CM | POA: Diagnosis not present

## 2021-11-24 LAB — COMPREHENSIVE METABOLIC PANEL
ALT: 14 U/L (ref 0–35)
AST: 18 U/L (ref 0–37)
Albumin: 4.2 g/dL (ref 3.5–5.2)
Alkaline Phosphatase: 60 U/L (ref 39–117)
BUN: 6 mg/dL (ref 6–23)
CO2: 31 mEq/L (ref 19–32)
Calcium: 9.2 mg/dL (ref 8.4–10.5)
Chloride: 100 mEq/L (ref 96–112)
Creatinine, Ser: 0.76 mg/dL (ref 0.40–1.20)
GFR: 100.22 mL/min (ref >60.00)
Glucose, Bld: 68 mg/dL — ABNORMAL LOW (ref 70–99)
Potassium: 4.7 mEq/L (ref 3.5–5.1)
Sodium: 137 mEq/L (ref 135–145)
Total Bilirubin: 0.4 mg/dL (ref 0.2–1.2)
Total Protein: 7.1 g/dL (ref 6.0–8.3)

## 2021-11-24 LAB — LIPID PANEL
Cholesterol: 159 mg/dL (ref 0–200)
HDL: 69 mg/dL (ref >39.00)
LDL Cholesterol: 75 mg/dL (ref 0–99)
NonHDL: 89.6
Total CHOL/HDL Ratio: 2
Triglycerides: 72 mg/dL (ref 0.0–149.0)
VLDL: 14.4 mg/dL (ref 0.0–40.0)

## 2021-11-24 LAB — CBC
HCT: 42.6 % (ref 36.0–46.0)
Hemoglobin: 13.9 g/dL (ref 12.0–15.0)
MCHC: 32.5 g/dL (ref 30.0–36.0)
MCV: 96.7 fl (ref 78.0–100.0)
Platelets: 240 10*3/uL (ref 150.0–400.0)
RBC: 4.41 Mil/uL (ref 3.87–5.11)
RDW: 13 % (ref 11.5–15.5)
WBC: 5.3 10*3/uL (ref 4.0–10.5)

## 2021-11-24 MED ORDER — AMOXICILLIN 500 MG PO CAPS: 1000.0000 mg | ORAL_CAPSULE | Freq: Every day | ORAL | 0 refills | Status: AC

## 2021-11-24 NOTE — Progress Notes (Signed)
Chief Complaint  Patient presents with   Annual Exam     Well Woman Monique Pierce is here for a complete physical.   Her last physical was >1 year ago.  Current diet: in general, a "healthy" diet. Current exercise: [employer] class, yoga. Contraception? Yes- IUD Fatigue out of ordinary? No Seatbelt? Yes Advanced directive? No  Health Maintenance Pap/HPV- Yes Tetanus- Yes HIV screening- Yes Hep C screening- Yes  ST ST, coughing, malaise over the past week or so. Daughter came home sick. Has an ear infection. Husband being tx'd for strep throat. Denies fevers, N/V/D, SOB, wheezing, ear pain/drainage, runny/stuffy nose, sinus pain. Nyquil and Mucinex not helpful.   Past Medical History:  Diagnosis Date   GERD (gastroesophageal reflux disease)      Past Surgical History:  Procedure Laterality Date   CESAREAN SECTION N/A 08/25/2015   Procedure: CESAREAN SECTION;  Surgeon: Dian Queen, MD;  Location: [facility];  Service: Obstetrics;  Laterality: N/A;    Medications  Current Outpatient Medications on File Prior to Visit  Medication Sig Dispense Refill   buPROPion (WELLBUTRIN XL) 150 MG 24 hr tablet TAKE 1 TABLET BY MOUTH EVERY DAY 90 tablet 1   busPIRone (BUSPAR) 10 MG tablet TAKE 1 TABLET BY MOUTH TWICE A DAY 180 tablet 1   fluticasone (FLONASE) 50 MCG/ACT nasal spray Place 2 sprays into both nostrils daily. 16 g 6   levocetirizine (XYZAL) 5 MG tablet Take 1 tablet (5 mg total) by mouth every evening. 30 tablet 2   sertraline (ZOLOFT) 100 MG tablet TAKE 1 TABLET (100 MG TOTAL) BY MOUTH DAILY. TAKE WITH 50 MG TAB FOR A TOTAL OF 150 MG DAILY. 90 tablet 1   sertraline (ZOLOFT) 50 MG tablet TAKE 1 TABLET (50 MG TOTAL) BY MOUTH DAILY. TAKE WITH 100 MG TAB FOR A TOTAL OF 150 MG DAILY. 90 tablet 2   Allergies No Known Allergies  Review of Systems: Constitutional:  no unexpected weight changes Eye:  no recent significant change in vision Ear/Nose/Mouth/Throat:  Ears:  no  tinnitus or vertigo and no recent change in hearing Nose/Mouth/Throat:  no complaints of nasal congestion, +sore throat Cardiovascular: no chest pain Respiratory:  + cough and no shortness of breath Gastrointestinal:  no abdominal pain, no change in bowel habits GU:  Female: negative for dysuria or pelvic pain Musculoskeletal/Extremities:  no pain of the joints Integumentary (Skin/Breast):  no abnormal skin lesions reported Neurologic:  no headaches Endocrine:  denies fatigue Hematologic/Lymphatic:  No areas of easy bleeding  Exam BP 100/72    Pulse 100    Temp 98.9 F (37.2 C) (Oral)    Ht 5' 4.5" (1.638 m)    Wt 162 lb 2 oz (73.5 kg)    SpO2 99%    BMI 27.40 kg/m  General:  well developed, well nourished, in no apparent distress Skin:  no significant moles, warts, or growths Head:  no masses, lesions, or tenderness Eyes:  pupils equal and round, sclera anicteric without injection Ears:  canals without lesions, TMs shiny without retraction, no obvious effusion, no erythema Nose:  nares patent, septum midline, mucosa normal, and no drainage or sinus tenderness Throat/Pharynx:  lips and gingiva without lesion; tongue and uvula midline; erythematous pharynx; no exudates or postnasal drainage Neck: neck supple without adenopathy, thyromegaly, or masses Lungs:  clear to auscultation, breath sounds equal bilaterally, no respiratory distress Cardio:  regular rate and rhythm, no bruits, no LE edema Abdomen:  abdomen soft, nontender; bowel  sounds normal; no masses or organomegaly Genital: Defer to GYN Musculoskeletal:  symmetrical muscle groups noted without atrophy or deformity Extremities:  no clubbing, cyanosis, or edema, no deformities, no skin discoloration Neuro:  gait normal; deep tendon reflexes normal and symmetric Psych: well oriented with normal range of affect and appropriate judgment/insight  Assessment and Plan  Well adult exam - Plan: CBC, Comprehensive metabolic panel,  Lipid panel  Sore throat - Plan: amoxicillin (AMOXIL) 500 MG capsule   Well 35 y.o. female. Counseled on diet and exercise. Other orders as above. ST: Will tx for strep w 1 g amox daily for 10 d, throw out toothbrush after 24 hrs on abx. NSAIDs, Tylenol prn. Send message if no improvement.  Politely declines flu shot and covid booster.  Advanced directive for provided.  Follow up in 6 mo or prn. The patient voiced understanding and agreement to the plan.  [place], DO 11/24/21 9:41 AM

## 2021-11-24 NOTE — Patient Instructions (Addendum)
Give [place] 2-3 business days to get the results of your labs back.   Keep the diet clean and stay active.  I recommend getting the updated bivalent covid vaccination booster at your convenience.   Ibuprofen 400-600 mg (2-3 over the counter strength tabs) every 6 hours as needed for pain.  OK to take Tylenol 1000 mg (2 extra strength tabs) or 975 mg (3 regular strength tabs) every 6 hours as needed.  Replace your toothbrush after 24 hrs of being on the medicine.   Send me a message 3 days if no improvement.   Let us know if you need anything.

## 2022-01-23 ENCOUNTER — Telehealth: Payer: Self-pay | Admitting: General Practice

## 2022-03-03 ENCOUNTER — Ambulatory Visit: Payer: 59 | Admitting: Family Medicine

## 2022-03-03 ENCOUNTER — Encounter: Payer: Self-pay | Admitting: Family Medicine

## 2022-03-03 VITALS — BP 103/64 | HR 101 | Ht 64.5 in | Wt 163.4 lb

## 2022-03-03 DIAGNOSIS — R202 Paresthesia of skin: Secondary | ICD-10-CM

## 2022-03-03 MED ORDER — NAPROXEN 500 MG PO TABS: 500.0000 mg | ORAL_TABLET | Freq: Two times a day (BID) | ORAL | 1 refills | Status: DC

## 2022-03-03 NOTE — Progress Notes (Signed)
? ?Acute Office Visit ? ?Subjective:  ? ? Patient ID: Monique Pierce, female    DOB: 02-Jul-1986, 36 y.o.   MRN: [id number] ? ?CC: finger tingling ? ? ?HPI ?Patient is in today for left finger tingling.  ? ?Patient reports she was recently in [place] and just flew back yesterday. States she fell asleep with left arm pinched between the seat on the airplane ride yesterday. When she woke up her whole left arm was asleep. Reports it resolved in about 15 minutes, however her 1st two fingers still feel very tingling and decreased grip/weakness. Described it as a pins and needles sensation that is more noticeable if she bends her elbow. She denies any pain, radiation, neck/arm pain or tingling. Reports she has full range of motion of arm and fingers, but grip feels slightly weaker/"off" with the first two fingers.  ? ? ? ? ? ? ?Past Medical History:  ?Diagnosis Date  ? GERD (gastroesophageal reflux disease)   ? ? ?Past Surgical History:  ?Procedure Laterality Date  ? CESAREAN SECTION N/A 08/25/2015  ? Procedure: CESAREAN SECTION;  Surgeon: Dian Queen, MD;  Location: [facility];  Service: Obstetrics;  Laterality: N/A;  ? ? ?Family History  ?Problem Relation Age of Onset  ? Breast cancer Maternal Grandmother   ? Neurologic Disorder Father   ? Cancer Neg Hx   ? Heart disease Neg Hx   ? ? ?Social History  ? ?Socioeconomic History  ? Marital status: Married  ?  Spouse name: Not on file  ? Number of children: 0  ? Years of education: Not on file  ? Highest education level: Not on file  ?Occupational History  ? Occupation: Electrical engineer  ?Tobacco Use  ? Smoking status: Never  ? Smokeless tobacco: Never  ?Substance and Sexual Activity  ? Alcohol use: No  ?  Alcohol/week: 7.0 standard drinks  ?  Types: 7 Glasses of wine per week  ? Drug use: No  ? Sexual activity: Yes  ?  Birth control/protection: None  ?Other Topics Concern  ? Not on file  ?Social History Narrative  ? Not on file  ? ?Social Determinants of Health  ? ?Financial  Resource Strain: Not on file  ?Food Insecurity: Not on file  ?Transportation Needs: Not on file  ?Physical Activity: Not on file  ?Stress: Not on file  ?Social Connections: Not on file  ?Intimate Partner Violence: Not on file  ? ? ?Outpatient Medications Prior to Visit  ?Medication Sig Dispense Refill  ? buPROPion (WELLBUTRIN XL) 150 MG 24 hr tablet TAKE 1 TABLET BY MOUTH EVERY DAY 90 tablet 1  ? busPIRone (BUSPAR) 10 MG tablet TAKE 1 TABLET BY MOUTH TWICE A DAY 180 tablet 1  ? fluticasone (FLONASE) 50 MCG/ACT nasal spray Place 2 sprays into both nostrils daily. 16 g 6  ? levocetirizine (XYZAL) 5 MG tablet Take 1 tablet (5 mg total) by mouth every evening. 30 tablet 2  ? sertraline (ZOLOFT) 100 MG tablet TAKE 1 TABLET (100 MG TOTAL) BY MOUTH DAILY. TAKE WITH 50 MG TAB FOR A TOTAL OF 150 MG DAILY. 90 tablet 1  ? sertraline (ZOLOFT) 50 MG tablet TAKE 1 TABLET (50 MG TOTAL) BY MOUTH DAILY. TAKE WITH 100 MG TAB FOR A TOTAL OF 150 MG DAILY. 90 tablet 2  ? ?No facility-administered medications prior to visit.  ? ? ?No Known Allergies ? ?Review of Systems ?All review of systems negative except what is listed in the HPI ? ?   ?  Objective:  ?  ?Physical Exam ?Vitals reviewed.  ?Constitutional:   ?   Appearance: Normal appearance.  ?HENT:  ?   Head: Normocephalic and atraumatic.  ?Cardiovascular:  ?   Pulses: Normal pulses.  ?Musculoskeletal:     ?   General: No swelling, tenderness, deformity or signs of injury. Normal range of motion.  ?   Cervical back: Normal range of motion and neck supple. No rigidity.  ?   Comments: Slightly weaker left handed grip at first two fingers  ?Skin: ?   General: Skin is warm and dry.  ?   Findings: No bruising, erythema, lesion or rash.  ?Neurological:  ?   General: No focal deficit present.  ?   Mental Status: She is alert and oriented to person, place, and time. Mental status is at baseline.  ?Psychiatric:     ?   Mood and Affect: Mood normal.     ?   Behavior: Behavior normal.     ?    Thought Content: Thought content normal.     ?   Judgment: Judgment normal.  ? ? ?BP 103/64   Pulse (!) 101   Ht 5' 4.5" (1.638 m)   Wt 163 lb 6.4 oz (74.1 kg)   BMI 27.61 kg/m?  ?Wt Readings from Last 3 Encounters:  ?03/03/22 163 lb 6.4 oz (74.1 kg)  ?11/24/21 162 lb 2 oz (73.5 kg)  ?08/18/21 156 lb (70.8 kg)  ? ? ?There are no preventive care reminders to display for this patient. ? ?There are no preventive care reminders to display for this patient. ? ? ?Lab Results  ?Component Value Date  ? TSH 0.64 02/21/2018  ? ?Lab Results  ?Component Value Date  ? WBC 5.3 11/24/2021  ? HGB 13.9 11/24/2021  ? HCT 42.6 11/24/2021  ? MCV 96.7 11/24/2021  ? PLT 240.0 11/24/2021  ? ?Lab Results  ?Component Value Date  ? NA 137 11/24/2021  ? K 4.7 11/24/2021  ? CO2 31 11/24/2021  ? GLUCOSE 68 (L) 11/24/2021  ? BUN 6 11/24/2021  ? CREATININE 0.76 11/24/2021  ? BILITOT 0.4 11/24/2021  ? ALKPHOS 60 11/24/2021  ? AST 18 11/24/2021  ? ALT 14 11/24/2021  ? PROT 7.1 11/24/2021  ? ALBUMIN 4.2 11/24/2021  ? CALCIUM 9.2 11/24/2021  ? GFR 100.22 11/24/2021  ? ?Lab Results  ?Component Value Date  ? CHOL 159 11/24/2021  ? ?Lab Results  ?Component Value Date  ? HDL 69.00 11/24/2021  ? ?Lab Results  ?Component Value Date  ? [place] 75 11/24/2021  ? ?Lab Results  ?Component Value Date  ? TRIG 72.0 11/24/2021  ? ?Lab Results  ?Component Value Date  ? CHOLHDL 2 11/24/2021  ? ?No results found for: HGBA1C ? ?   ?Assessment & Plan:  ? ?1. Left hand paresthesia - first two fingers only ?Sounds like possibly some compression/inflammation around a nerve due to the position you were sleeping. We can try an antiinflammatory to see if this will help relieve the pressure and stop the tingling. Stay active, try to avoid tensing up at your elbow so that we don't add more compression. If this doesn't help we can consider a  burst of prednisone for a stronger antiinflammatory effect. Sometimes finger/hand numbness/tingling actually comes from the neck, so  if not making any progress we can consider neck xray, although I think this is likely more local to your arm given the position you were sleeping in. If you have any changes  in temperature, color, swelling, other arm pain, radiation of pain, unusual weakness, etc then please let us know.  ? ?Please contact office for follow-up if symptoms do not improve or worsen. Seek emergency care if symptoms become severe. ? ?Meds ordered this encounter  ?Medications  ? naproxen (NAPROSYN) 500 MG tablet  ?  Sig: Take 1 tablet (500 mg total) by mouth 2 (two) times daily with a meal.  ?  Dispense:  30 tablet  ?  Refill:  1  ?  Order Specific Question:   Supervising Provider  ?  Answer:   Penni Homans A [[id number]]  ?Educated on medication, side effects, etc. She is aware not to combing with other NSAIDs. ? ? ?Terrilyn Saver, NP ? ?

## 2022-03-03 NOTE — Patient Instructions (Signed)
Sounds like possibly some compression/inflammation around a nerve due to the position you were sleeping. We can try an antiinflammatory to see if this will help relieve the pressure and stop the tingling. Stay active, try to avoid tensing up at your elbow so that we don't add more compression. If this doesn't help we can consider a  burst of prednisone for a stronger antiinflammatory effect. Sometimes finger/hand numbness/tingling actually comes from the neck, so if not making any progress we can consider neck xray, although I think this is likely more local to your arm given the position you were sleeping in. If you have any changes in temperature, color, swelling, other arm pain, radiation of pain, unusual weakness, etc then please let us know.  ? ?Please contact office for follow-up if symptoms do not improve or worsen. Seek emergency care if symptoms become severe. ? ?

## 2022-03-18 ENCOUNTER — Encounter: Payer: Self-pay | Admitting: Family Medicine

## 2022-03-18 ENCOUNTER — Telehealth (INDEPENDENT_AMBULATORY_CARE_PROVIDER_SITE_OTHER): Payer: 59 | Admitting: Family Medicine

## 2022-03-18 VITALS — Ht 65.4 in | Wt 155.0 lb

## 2022-03-18 DIAGNOSIS — K529 Noninfective gastroenteritis and colitis, unspecified: Secondary | ICD-10-CM

## 2022-03-18 NOTE — Progress Notes (Signed)
Chief Complaint  ?Patient presents with  ? Nausea  ?  Pt believes she has food poisoning. Sxs began Monday. She states that foods smell bad and she did a tele-health visit where she was given Metoclopramide '10mg'$  twice daily.  ? ? ? ?Subjective ?Monique Pierce is a 36 y.o. female who presents with vomiting and diarrhea. Due to COVID-19 pandemic, we are interacting via web portal for an electronic face-to-face visit. I verified patient's ID using 2 identifiers. Patient agreed to proceed with visit via this method. Patient is at home, I am at office. Patient and I are present for visit.  ? ?Symptoms began 2 d ago. The day before she ate out at [employer].  ?Patient has cramping, vomiting, diarrhea, chills, and myalgias; most of these s/ss have resolved, having lingering aversion to food and everything smells bad.  ?Patient denies fever, bleeding, recent abx, and URI symptoms ?Treatment to date: Reglan, drinking Gatorade ?Sick contacts: none known ? ?Past Medical History:  ?Diagnosis Date  ? GERD (gastroesophageal reflux disease)   ? ? ?Exam ?Ht 5' 5.4" (1.661 m)   Wt 155 lb (70.3 kg)   BMI 25.48 kg/m?  ?No conversational dyspnea ?Age appropriate judgment and insight ?Nml affect and mood ? ?Assessment and Plan ? ?Gastroenteritis ? ?Probably picked up some virus from the restaurant. S/s's resolving. Recommended saline rinses and INCS use for the smell. Food aversion should improve with it. Will refer to ENT if no improvement over the weekend. F/u as originally scheduled.  ?The patient voiced understanding and agreement to the plan. ? ?Shelda Pal, DO ?03/18/22  ?11:58 AM ? ?

## 2022-04-06 ENCOUNTER — Encounter: Payer: Self-pay | Admitting: Family Medicine

## 2022-04-07 ENCOUNTER — Other Ambulatory Visit: Payer: Self-pay | Admitting: Family Medicine

## 2022-04-07 DIAGNOSIS — F411 Generalized anxiety disorder: Secondary | ICD-10-CM

## 2022-04-07 MED ORDER — SERTRALINE HCL 50 MG PO TABS: 50.0000 mg | ORAL_TABLET | Freq: Every day | ORAL | 2 refills | Status: DC

## 2022-04-17 ENCOUNTER — Other Ambulatory Visit: Payer: Self-pay | Admitting: Family Medicine

## 2022-04-17 DIAGNOSIS — F330 Major depressive disorder, recurrent, mild: Secondary | ICD-10-CM

## 2022-05-25 ENCOUNTER — Encounter: Payer: Self-pay | Admitting: Family Medicine

## 2022-05-25 ENCOUNTER — Ambulatory Visit: Payer: 59 | Admitting: Family Medicine

## 2022-05-25 VITALS — BP 110/78 | HR 86 | Temp 98.1°F | Ht 64.5 in | Wt 162.5 lb

## 2022-05-25 DIAGNOSIS — F411 Generalized anxiety disorder: Secondary | ICD-10-CM | POA: Diagnosis not present

## 2022-05-25 DIAGNOSIS — F339 Major depressive disorder, recurrent, unspecified: Secondary | ICD-10-CM | POA: Diagnosis not present

## 2022-05-25 MED ORDER — BUSPIRONE HCL 10 MG PO TABS: 10.0000 mg | ORAL_TABLET | Freq: Two times a day (BID) | ORAL | 2 refills | Status: DC

## 2022-05-25 NOTE — Progress Notes (Signed)
Chief Complaint  Patient presents with   Follow-up    6 month     Subjective Monique Pierce presents for f/u anxiety/depression.  Pt is currently being treated with Zoloft 50 mg/d, Wellbutrin XL 150 mg/d, BuSpar 10 mg bid.  Reports doing well since treatment. No thoughts of harming self or others. No self-medication with alcohol, prescription drugs or illicit drugs. Pt is looking for another counselor/psychologist.  Past Medical History:  Diagnosis Date   GERD (gastroesophageal reflux disease)    Allergies as of 05/25/2022   No Known Allergies      Medication List        Accurate as of May 25, 2022  9:32 AM. If you have any questions, ask your nurse or doctor.          buPROPion 150 MG 24 hr tablet Commonly known as: WELLBUTRIN XL TAKE 1 TABLET BY MOUTH EVERY DAY   busPIRone 10 MG tablet Commonly known as: BUSPAR TAKE 1 TABLET BY MOUTH TWICE A DAY   fluticasone 50 MCG/ACT nasal spray Commonly known as: FLONASE Place 2 sprays into both nostrils daily.   levocetirizine 5 MG tablet Commonly known as: XYZAL Take 1 tablet (5 mg total) by mouth every evening.   naproxen 500 MG tablet Commonly known as: NAPROSYN Take 1 tablet (500 mg total) by mouth 2 (two) times daily with a meal.   sertraline 50 MG tablet Commonly known as: ZOLOFT Take 1 tablet (50 mg total) by mouth daily.        Exam BP 110/78   Pulse 86   Temp 98.1 F (36.7 C) (Oral)   Ht 5' 4.5" (1.638 m)   Wt 162 lb 8 oz (73.7 kg)   SpO2 98%   BMI 27.46 kg/m  General:  well developed, well nourished, in no apparent distress Lungs:  No respiratory distress Psych: well oriented with normal range of affect and age-appropriate judgement/insight, alert and oriented x4.  Assessment and Plan  GAD (generalized anxiety disorder)  Depression, recurrent (HCC)  Chronic, stable. Cont  Zoloft 50 mg/d, Wellbutrin XL 150 mg/d, BuSpar 10 mg bid. Counseling info and sleep hygiene provided.  F/u in 6  mo or prn. The patient voiced understanding and agreement to the plan.  [place], DO 05/25/22 9:32 AM

## 2022-05-25 NOTE — Patient Instructions (Addendum)
Stay active.   Counseling resources:  Contact [phone] to schedule an appointment or inquire about cost/insurance coverage.  [facility] located at [address] [place], [place], [place].  Phone number = [phone].  Dr. Lennice Sites - Adult Psychiatry.    [facility] located at [place], [place], [place]. Phone number = [phone].   [facility] located at [address] [place], [place].  Phone number = [phone].   [facility] located at [place], [place], [place].  Phone number = [phone].  Sleep Hygiene Tips: Do not watch TV or look at screens within 1 hour of going to bed. If you do, make sure there is a blue light filter (nighttime mode) involved. Try to go to bed around the same time every night. Wake up at the same time within 1 hour of regular time. Ex: If you wake up at 7 AM for work, do not sleep past 8 AM on days that you don't work. Do not drink alcohol before bedtime. Do not consume caffeine-containing beverages after noon or within 9 hours of intended bedtime. Get regular exercise/physical activity in your life, but not within 2 hours of planned bedtime. Do not take naps.  Do not eat within 2 hours of planned bedtime. Melatonin, 3-5 mg 30-60 minutes before planned bedtime may be helpful.  The bed should be for sleep or sex only. If after 20-30 minutes you are unable to fall asleep, get up and do something relaxing. Do this until you feel ready to go to sleep again.   Let us know if you need anything.

## 2022-07-15 ENCOUNTER — Encounter: Payer: Self-pay | Admitting: Family Medicine

## 2022-07-21 LAB — HM PAP SMEAR

## 2022-08-25 ENCOUNTER — Ambulatory Visit: Payer: 59 | Admitting: Family Medicine

## 2022-08-25 ENCOUNTER — Encounter: Payer: Self-pay | Admitting: Family Medicine

## 2022-08-25 VITALS — BP 118/78 | HR 91 | Temp 98.6°F | Resp 16 | Ht 64.5 in | Wt 167.2 lb

## 2022-08-25 DIAGNOSIS — J309 Allergic rhinitis, unspecified: Secondary | ICD-10-CM

## 2022-08-25 MED ORDER — PREDNISONE 20 MG PO TABS: 40.0000 mg | ORAL_TABLET | Freq: Every day | ORAL | 0 refills | Status: AC

## 2022-08-25 NOTE — Patient Instructions (Addendum)
Continue to push fluids, practice good hand hygiene, and cover your mouth if you cough.  If you start having fevers, shaking or shortness of breath, seek immediate care.  OK to take Tylenol 1000 mg (2 extra strength tabs) or 975 mg (3 regular strength tabs) every 6 hours as needed.  For late summer/fall:  Claritin (loratadine), Allegra (fexofenadine), Zyrtec (cetirizine) which is also equivalent to Xyzal (levocetirizine); these are listed in order from weakest to strongest. Generic, and therefore cheaper, options are in the parentheses.   Flonase (fluticasone); nasal spray that is over the counter. 2 sprays each nostril, once daily. Aim towards the same side eye when you spray.  There are available OTC, and the generic versions, which may be cheaper, are in parentheses. Show this to a pharmacist if you have trouble finding any of these items.  Let us know if you need anything.

## 2022-08-25 NOTE — Progress Notes (Signed)
Chief Complaint  Patient presents with   Sore Throat    Pt states sxs started since the last week of August and states sxs have been off and on. Pt states being seen at [facility] on Sept 9 and was given Amoxicillin and without any relief. Pt reports sore throat, cough, and congestion. Pt reports taking COVID test this morning and was negative.     Monique Pierce here for URI complaints.  Duration: 3 weeks Associated symptoms: sinus congestion, rhinorrhea, sore throat, and coughing early on Denies: sinus pain, itchy watery eyes, ear pain, ear drainage, wheezing, shortness of breath, myalgia, and fevers, N/V/D Treatment to date: Mucinex, amoxicillin Sick contacts: No Tested neg for covid.   Past Medical History:  Diagnosis Date   GERD (gastroesophageal reflux disease)     Objective BP 118/78 (BP Location: Right Arm, Patient Position: Sitting, Cuff Size: Normal)   Pulse 91   Temp 98.6 F (37 C) (Oral)   Resp 16   Ht 5' 4.5" (1.638 m)   Wt 167 lb 3.2 oz (75.8 kg)   SpO2 98%   BMI 28.26 kg/m  General: Awake, alert, appears stated age HEENT: AT, [place], ears patent b/l and TM's neg, nares patent w/o discharge, pharynx pink and without exudates, MMM Neck: No masses or asymmetry Heart: RRR Lungs: CTAB, no accessory muscle use Psych: Age appropriate judgment and insight, normal mood and affect  Allergic rhinitis, unspecified seasonality, unspecified trigger - Plan: predniSONE (DELTASONE) 20 MG tablet  This worked well last year. 5 d pred burst 40 mg/d. Consider INCS and po antihistamine during late summer/early fall time.  Continue to push fluids, practice good hand hygiene, cover mouth when coughing. F/u prn. If starting to experience fevers, shaking, or shortness of breath, seek immediate care. Pt voiced understanding and agreement to the plan.  [place], DO 08/25/22 2:09 PM

## 2022-10-01 ENCOUNTER — Other Ambulatory Visit: Payer: Self-pay | Admitting: Family Medicine

## 2022-10-01 ENCOUNTER — Encounter: Payer: Self-pay | Admitting: Family Medicine

## 2022-10-01 DIAGNOSIS — F330 Major depressive disorder, recurrent, mild: Secondary | ICD-10-CM

## 2022-10-15 ENCOUNTER — Encounter: Payer: Self-pay | Admitting: Family Medicine

## 2022-10-15 DIAGNOSIS — F411 Generalized anxiety disorder: Secondary | ICD-10-CM

## 2022-10-15 MED ORDER — SERTRALINE HCL 50 MG PO TABS: 50.0000 mg | ORAL_TABLET | Freq: Every day | ORAL | 2 refills | Status: DC

## 2022-10-19 ENCOUNTER — Other Ambulatory Visit: Payer: Self-pay | Admitting: Family Medicine

## 2022-10-19 DIAGNOSIS — F411 Generalized anxiety disorder: Secondary | ICD-10-CM

## 2022-10-19 MED ORDER — SERTRALINE HCL 100 MG PO TABS: 100.0000 mg | ORAL_TABLET | Freq: Every day | ORAL | 2 refills | Status: DC

## 2022-10-27 ENCOUNTER — Ambulatory Visit: Payer: 59 | Admitting: Family Medicine

## 2022-10-27 ENCOUNTER — Encounter: Payer: Self-pay | Admitting: Family Medicine

## 2022-10-27 VITALS — BP 112/68 | HR 87 | Temp 98.0°F | Ht 64.0 in | Wt 164.0 lb

## 2022-10-27 DIAGNOSIS — M545 Low back pain, unspecified: Secondary | ICD-10-CM

## 2022-10-27 DIAGNOSIS — G8929 Other chronic pain: Secondary | ICD-10-CM

## 2022-10-27 MED ORDER — TIZANIDINE HCL 4 MG PO TABS: 4.0000 mg | ORAL_TABLET | Freq: Four times a day (QID) | ORAL | 0 refills | Status: DC | PRN

## 2022-10-27 NOTE — Progress Notes (Signed)
Musculoskeletal Exam  Patient: Monique Pierce DOB: [birth year]  DOS: 10/27/2022  SUBJECTIVE:  Chief Complaint:   Chief Complaint  Patient presents with   Follow-up    Leg, hip and back pain on the right side.    Monique Pierce is a 36 y.o.  female for evaluation and treatment of back pain.   Onset:  1  year  ago.  No inj or change in activity. .  Location: lower R back Character:  dull and tingling in RLE Progression of issue:  has worsened around 2 weeks ago Associated symptoms: radiates down R medial thigh Denies bowel/bladder incontinence or weakness Treatment: to date has been: Sleeping w pillow between knees.   Neurovascular symptoms: no  Past Medical History:  Diagnosis Date   GERD (gastroesophageal reflux disease)     Objective:  VITAL SIGNS: BP 112/68 (BP Location: Left Arm, Patient Position: Sitting, Cuff Size: Normal)   Pulse 87   Temp 98 F (36.7 C) (Oral)   Ht '5\' 4"'$  (1.626 m)   Wt 164 lb (74.4 kg)   SpO2 98%   BMI 28.15 kg/m  Constitutional: Well formed, well developed. No acute distress. HENT: Normocephalic, atraumatic.  Thorax & Lungs:  No accessory muscle use Musculoskeletal: low back.   Tenderness to palpation: mild ttp in lumbar parasp msc on R Deformity: no Ecchymosis: no Straight leg test: negative for Reasonably good hamstring flexibility b/l. Neurologic: Normal sensory function. No focal deficits noted. DTR's equal and symmetric in LE's. No clonus. 5/5 strength b/l in LE's. Psychiatric: Normal mood. Age appropriate judgment and insight. Alert & oriented x 3.    Assessment:  Chronic right-sided low back pain without sciatica - Plan: tiZANidine (ZANAFLEX) 4 MG tablet  Plan: Chronic, uncontrolled/worsening. Stretches/exercises, heat, ice, Tylenol, NSAIDs. Trial Zanaflex, start first dose at night to see if safe to take during day. Send message in 3-4 weeks if no better and will set up w PT.  F/u prn. The patient voiced understanding and  agreement to the plan.   [place], DO 10/27/22  8:32 AM

## 2022-10-27 NOTE — Patient Instructions (Addendum)

## 2022-11-13 ENCOUNTER — Encounter: Payer: Self-pay | Admitting: Family Medicine

## 2022-11-13 DIAGNOSIS — K649 Unspecified hemorrhoids: Secondary | ICD-10-CM

## 2023-01-20 ENCOUNTER — Other Ambulatory Visit: Payer: Self-pay | Admitting: Family Medicine

## 2023-01-20 DIAGNOSIS — F330 Major depressive disorder, recurrent, mild: Secondary | ICD-10-CM

## 2023-03-02 ENCOUNTER — Encounter: Payer: Self-pay | Admitting: Family Medicine

## 2023-04-01 ENCOUNTER — Encounter: Payer: Self-pay | Admitting: Behavioral Health

## 2023-04-01 ENCOUNTER — Ambulatory Visit (INDEPENDENT_AMBULATORY_CARE_PROVIDER_SITE_OTHER): Payer: 59 | Admitting: Behavioral Health

## 2023-04-01 DIAGNOSIS — F411 Generalized anxiety disorder: Secondary | ICD-10-CM

## 2023-04-01 DIAGNOSIS — F331 Major depressive disorder, recurrent, moderate: Secondary | ICD-10-CM

## 2023-04-01 NOTE — Progress Notes (Signed)
                Kristilyn Coltrane M Brinleigh Tew, LCMHC 
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
## 2023-04-06 ENCOUNTER — Encounter: Payer: Self-pay | Admitting: Behavioral Health

## 2023-04-06 ENCOUNTER — Ambulatory Visit (INDEPENDENT_AMBULATORY_CARE_PROVIDER_SITE_OTHER): Payer: 59 | Admitting: Behavioral Health

## 2023-04-06 DIAGNOSIS — F411 Generalized anxiety disorder: Secondary | ICD-10-CM

## 2023-04-06 NOTE — Progress Notes (Signed)
                Monique Pierce M Sang [place], LCMHC 
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
## 2023-04-17 ENCOUNTER — Other Ambulatory Visit: Payer: Self-pay | Admitting: Family Medicine

## 2023-04-17 DIAGNOSIS — F330 Major depressive disorder, recurrent, mild: Secondary | ICD-10-CM

## 2023-04-21 ENCOUNTER — Ambulatory Visit (INDEPENDENT_AMBULATORY_CARE_PROVIDER_SITE_OTHER): Payer: 59 | Admitting: Behavioral Health

## 2023-04-21 ENCOUNTER — Encounter: Payer: Self-pay | Admitting: Behavioral Health

## 2023-04-21 DIAGNOSIS — F411 Generalized anxiety disorder: Secondary | ICD-10-CM

## 2023-04-21 NOTE — Progress Notes (Signed)
[facility] Counselor/Therapist Progress Note  Patient ID: Monique Pierce, MRN: [id number],    Date: 04/21/2023  Time Spent: 57 minutes via video session.  The patient was at home and this therapist was in his home office.  Treatment Type: Individual Therapy  Reported Symptoms: Anxiety/stress  Mental Status Exam: Appearance:  Well Groomed     Behavior: Appropriate  Motor: Normal  Speech/Language:  Normal Rate  Affect: Appropriate  Mood: normal  Thought process: normal  Thought content:   WNL  Sensory/Perceptual disturbances:   WNL  Orientation: oriented to person, place, time/date, situation, day of week, and month of year  Attention: Good  Concentration: Good  Memory: WNL  Fund of knowledge:  Good  Insight:   Good  Judgment:  Good  Impulse Control: Good   Risk Assessment: Danger to Self:  No Self-injurious Behavior: No Danger to Others: No Duty to Warn:no Physical Aggression / Violence:No  Access to Firearms a concern: No  Gang Involvement:No   Subjective: The patient reported that a situation with her daughter at the end of the school led to some questions about how she can help her daughter with emotional regulation.  She says that influenced by the fact that her father got upset about everything and was angry often and her mother did not teach her healthy emotional regulation.  She said because of that she wants to make sure that her daughter feels free to express what she is feeling but also wants to make sure that it does not go too far or that she is doing the right things.  She cited as an example when her daughter was younger and could not swim that she was swimming with a cousin and the daughter got upset because she could not swim like her cousin.  Her mother said that the cousin later said to her that the patient's job was crying like a baby and her mother thought that was funny.  She has not said that for the patient's child but the patient says that  the clear example of her mother not recognizing what healthy emotions are or what appropriate emotions are.  We looked at emotions in terms of how that influences how she parents when frustrations come along for her daughter.  A prime example is bedtime and bath where her daughter has a little bit of a reaction at times and sometimes more before bath and bedtime.  We talked about alternative routine, calming techniques, being logical with the child in different ways that she could help calm that down but also the process of emotional maturity with her child.  We begin to look at how that affects the patient emotionally and in the next session we will work on intentional mindfulness and emotional expression for the patient.    Interventions: Cognitive Behavioral Therapy  Diagnosis: Generalized anxiety disorder.  Treatment plan: We will use cognitive behavioral therapy as well as elements of dialectical behavior and perseverance in her therapy to improve the patient's ability to manage anxiety symptoms and better handle stress.  We will look for causes for stress and anxiety and explore ways to lower it, resolve core conflicts that are contributing to anxiety as well as manage thoughts and worrisome thinking contributing to the feelings of anxiety.  Interventions included providing education about stress and anxiety and its symptoms and triggers, teach coping skills for managing anxiety symptoms such as guided imagery relaxation breathing and progressive muscle relaxation.  We will also use cognitive  behavioral therapy to identify and change anxiety provoking thoughts and behavior patterns as well as dialectical behavior therapy distress tolerance and mindfulness skills.  Goals is to see a 50% reduction in stress and anxiety symptoms with a target date of October 07, 2023.  Plan: I will meet with the patient every 2 weeks  [name], [facility]                 [name], [facility]

## 2023-04-29 ENCOUNTER — Encounter: Payer: Self-pay | Admitting: Behavioral Health

## 2023-04-29 ENCOUNTER — Ambulatory Visit (INDEPENDENT_AMBULATORY_CARE_PROVIDER_SITE_OTHER): Payer: 59 | Admitting: Behavioral Health

## 2023-04-29 DIAGNOSIS — F411 Generalized anxiety disorder: Secondary | ICD-10-CM | POA: Diagnosis not present

## 2023-04-29 NOTE — Progress Notes (Signed)
[facility] Counselor/Therapist Progress Note  Patient ID: ALLAN URBANIAK, MRN: [id number],    Date: 04/29/2023  Time Spent: 57 minutes via video session.  The patient was at home and this therapist was in his home office.  Treatment Type: Individual Therapy  Reported Symptoms: Anxiety/stress  Mental Status Exam: Appearance:  Well Groomed     Behavior: Appropriate  Motor: Normal  Speech/Language:  Normal Rate  Affect: Appropriate  Mood: normal  Thought process: normal  Thought content:   WNL  Sensory/Perceptual disturbances:   WNL  Orientation: oriented to person, place, time/date, situation, day of week, and month of year  Attention: Good  Concentration: Good  Memory: WNL  Fund of knowledge:  Good  Insight:   Good  Judgment:  Good  Impulse Control: Good   Risk Assessment: Danger to Self:  No Self-injurious Behavior: No Danger to Others: No Duty to Warn:no Physical Aggression / Violence:No  Access to Firearms a concern: No  Gang Involvement:No   Subjective: The patient's daughter at her side when beautifully this past weekend and her husband did go to [facility] for his mother's birthday.  She felt like things are going fairly well and that a small situation with her daughter on the way to school Monday wiped her out emotionally.  We looked at why that particular situation drained her emotions when she knows that has appeared she handled it well and set very clear respectful boundaries.  We talked about the difficulty that comes with parenting even if done the right way with the right intention and the intentional mindfulness as parents that we have to be aware of.  I reminded the patient that she had been through a significant amount which empties or drains the emotional tank and we spent time talking about how to cognitively as well as through self-care continue to feel that emotional tank knowing that she has a lot on her plate secondary to that.  She recognizes  that she has a lot of responsibility which takes a lot of time and energy.  She basically is a single parent with the exception of on weekends so we talked about things that she can do in terms of carpooling etc.  Her daughter will be with her parents the month of June we talked about the importance of starting female doing something good for herself.  She also feels somewhat overwhelmed by the state of her home because she is so busy she cannot take care of it the way she wants to.  We talked about outside options including an Dance movement psychotherapist, some nuclear house but also compartmentalizing how she takes care of her home especially as her daughter's: For the month of June she has a little less responsibility.  I validated her being aware of the fact that she wants to parent differently than her parented her and that even if she feels at times she is not doing well that she is a good parent.  We talked about the positive self-talk that she can practice on.  She also recognizes that there are some issues connected to her father growing up they are probably playing a part in all this and we will begin to address that in the next session.    Interventions: Cognitive Behavioral Therapy  Diagnosis: Generalized anxiety disorder.  Treatment plan: We will use cognitive behavioral therapy as well as elements of dialectical behavior and perseverance in her therapy to improve the patient's ability to manage anxiety symptoms and better handle  stress.  We will look for causes for stress and anxiety and explore ways to lower it, resolve core conflicts that are contributing to anxiety as well as manage thoughts and worrisome thinking contributing to the feelings of anxiety.  Interventions included providing education about stress and anxiety and its symptoms and triggers, teach coping skills for managing anxiety symptoms such as guided imagery relaxation breathing and progressive muscle relaxation.  We will also use cognitive  behavioral therapy to identify and change anxiety provoking thoughts and behavior patterns as well as dialectical behavior therapy distress tolerance and mindfulness skills.  Goals is to see a 50% reduction in stress and anxiety symptoms with a target date of October 07, 2023.  Plan: I will meet with the patient every 2 weeks  [name], [facility]                 [name], [facility]               [name], [facility]

## 2023-05-05 ENCOUNTER — Encounter: Payer: Self-pay | Admitting: Behavioral Health

## 2023-05-05 ENCOUNTER — Ambulatory Visit (INDEPENDENT_AMBULATORY_CARE_PROVIDER_SITE_OTHER): Payer: 59 | Admitting: Behavioral Health

## 2023-05-05 DIAGNOSIS — F411 Generalized anxiety disorder: Secondary | ICD-10-CM | POA: Diagnosis not present
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
## 2023-05-19 ENCOUNTER — Encounter: Payer: Self-pay | Admitting: Behavioral Health

## 2023-05-19 ENCOUNTER — Ambulatory Visit (INDEPENDENT_AMBULATORY_CARE_PROVIDER_SITE_OTHER): Payer: 59 | Admitting: Behavioral Health

## 2023-05-19 DIAGNOSIS — F411 Generalized anxiety disorder: Secondary | ICD-10-CM | POA: Diagnosis not present
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
## 2023-05-28 ENCOUNTER — Telehealth: Payer: Self-pay | Admitting: Family Medicine

## 2023-05-28 DIAGNOSIS — F411 Generalized anxiety disorder: Secondary | ICD-10-CM

## 2023-05-28 MED ORDER — SERTRALINE HCL 100 MG PO TABS: 100.0000 mg | ORAL_TABLET | Freq: Every day | ORAL | 2 refills | Status: DC

## 2023-05-28 NOTE — Telephone Encounter (Signed)
Pt called stating she had misplaced her medication and needs a refill of the following meds:  Prescription Request  05/28/2023  Is this a "Controlled Substance" medicine? Yes  LOV: Visit date not found  What is the name of the medication or equipment?   sertraline (ZOLOFT) 100 MG tablet [[id number]]   Have you contacted your pharmacy to request a refill? No   Which pharmacy would you like this sent to?   [id number] - [place], [place] - [address] [place] [address] Phone: [phone] Fax: [phone]  Patient notified that their request is being sent to the clinical staff for review and that they should receive a response within 2 business days.   Please advise at Mobile [phone] (mobile)

## 2023-05-28 NOTE — Addendum Note (Signed)
Addended by: Maximino Sarin on: 05/28/2023 01:36 PM   Modules accepted: Orders

## 2023-05-28 NOTE — Telephone Encounter (Signed)
Rx sent 

## 2023-06-02 ENCOUNTER — Ambulatory Visit (INDEPENDENT_AMBULATORY_CARE_PROVIDER_SITE_OTHER): Payer: 59 | Admitting: Behavioral Health

## 2023-06-02 ENCOUNTER — Encounter: Payer: Self-pay | Admitting: Family Medicine

## 2023-06-02 ENCOUNTER — Encounter: Payer: Self-pay | Admitting: Behavioral Health

## 2023-06-02 ENCOUNTER — Other Ambulatory Visit: Payer: Self-pay | Admitting: Family Medicine

## 2023-06-02 DIAGNOSIS — F411 Generalized anxiety disorder: Secondary | ICD-10-CM

## 2023-06-02 MED ORDER — SERTRALINE HCL 50 MG PO TABS: 50.0000 mg | ORAL_TABLET | Freq: Every day | ORAL | 2 refills | Status: DC

## 2023-06-02 NOTE — Progress Notes (Signed)
[facility] Counselor/Therapist Progress Note  Patient ID: Monique Pierce, MRN: [id number],    Date: 06/02/2023  Time Spent: 56 minutes from 901 to 9:57 AM This session was held via video teletherapy. The patient consented to the video teletherapy and was located in her home office during this session. She is aware it is the responsibility of the patient to secure confidentiality on her end of the session. The provider was in a private home office for the duration of this session.      Treatment Type: Individual Therapy  Reported Symptoms: Anxiety/stress  Mental Status Exam: Appearance:  Well Groomed     Behavior: Appropriate  Motor: Normal  Speech/Language:  Normal Rate  Affect: Appropriate  Mood: normal  Thought process: normal  Thought content:   WNL  Sensory/Perceptual disturbances:   WNL  Orientation: oriented to person, place, time/date, situation, day of week, and month of year  Attention: Good  Concentration: Good  Memory: WNL  Fund of knowledge:  Good  Insight:   Good  Judgment:  Good  Impulse Control: Good   Risk Assessment: Danger to Self:  No Self-injurious Behavior: No Danger to Others: No Duty to Warn:no Physical Aggression / Violence:No  Access to Firearms a concern: No  Gang Involvement:No   Subjective: The patient's daughter has been with her parents over the past week and a half and is coming home tomorrow.  She has enjoyed the time to be able to get things done that otherwise are very difficult and that feels very productive which elevates her mood.  It also has given her some time to think and reflect on who she is and all that she does.  We looked at her relationships with men including her father, her boyfriend in college and her husband and how they are alike and similar and how that affects who the patient is.  We looked at some of the things that she feels her life with as well as beginning future thinking of what she would like to fill  her life with.  She cited an example of something that she is making and direction something like that could go to be a positive step in her life.  She looked at something she had tried in the past but lost interest in the what she had learned from that.  We talked about narrative therapy and how she sees herself in different roles but also how she sees being able to change the narrative life story as well as the parts she plays in that.   The patient does contract for safety having no thoughts of hurting herself or anyone else. Interventions: Cognitive Behavioral Therapy  Diagnosis: Generalized anxiety disorder.  Treatment plan: We will use cognitive behavioral therapy as well as elements of dialectical behavior and perseverance in her therapy to improve the patient's ability to manage anxiety symptoms and better handle stress.  We will look for causes for stress and anxiety and explore ways to lower it, resolve core conflicts that are contributing to anxiety as well as manage thoughts and worrisome thinking contributing to the feelings of anxiety.  Interventions included providing education about stress and anxiety and its symptoms and triggers, teach coping skills for managing anxiety symptoms such as guided imagery relaxation breathing and progressive muscle relaxation.  We will also use cognitive behavioral therapy to identify and change anxiety provoking thoughts and behavior patterns as well as dialectical behavior therapy distress tolerance and mindfulness skills.  Goals is to  see a 50% reduction in stress and anxiety symptoms with a target date of October 07, 2023. Progress: 30% Plan: I will meet with the patient every 2 weeks  [name], [facility]                                               [name], [facility]               [name], [facility]               [name], [facility]

## 2023-06-04 ENCOUNTER — Encounter: Payer: Self-pay | Admitting: Family Medicine

## 2023-06-09 ENCOUNTER — Ambulatory Visit: Payer: 59 | Admitting: Behavioral Health

## 2023-06-23 ENCOUNTER — Encounter: Payer: Self-pay | Admitting: Behavioral Health

## 2023-06-23 ENCOUNTER — Ambulatory Visit (INDEPENDENT_AMBULATORY_CARE_PROVIDER_SITE_OTHER): Payer: 59 | Admitting: Behavioral Health

## 2023-06-23 DIAGNOSIS — F411 Generalized anxiety disorder: Secondary | ICD-10-CM | POA: Diagnosis not present

## 2023-06-23 NOTE — Progress Notes (Signed)
[facility] Counselor/Therapist Progress Note  Patient ID: Monique Pierce, MRN: [id number],    Date: 06/23/2023  Time Spent: 57 minutes from 901 to 9:58 AM This session was held via video teletherapy. The patient consented to the video teletherapy and was located in her home during this session. She is aware it is the responsibility of the patient to secure confidentiality on her end of the session. The provider was in a private home office for the duration of this session.      Treatment Type: Individual Therapy  Reported Symptoms: Anxiety/stress  Mental Status Exam: Appearance:  Well Groomed     Behavior: Appropriate  Motor: Normal  Speech/Language:  Normal Rate  Affect: Appropriate  Mood: normal  Thought process: normal  Thought content:   WNL  Sensory/Perceptual disturbances:   WNL  Orientation: oriented to person, place, time/date, situation, day of week, and month of year  Attention: Good  Concentration: Good  Memory: WNL  Fund of knowledge:  Good  Insight:   Good  Judgment:  Good  Impulse Control: Good   Risk Assessment: Danger to Self:  No Self-injurious Behavior: No Danger to Others: No Duty to Warn:no Physical Aggression / Violence:No  Access to Firearms a concern: No  Gang Involvement:No   Subjective: We use cognitive behavioral therapy as well as elements of dialectical behavior therapy to explore how the patient sees herself based on how she was raised and how she sees herself as a parent.  She intellectually recognizes that she does not parent like her parents did with her and knows that she is a much better parent than they were.  She also recognizes that for the most part she parents and alone as her husband's work keeps him gone most of the day and a good part of Sundays.  Her head knows that she does a good job but her heart still makes her question at times if she is doing the right thing.  We talked about what her fears are with the primary  fear being that her daughter will see her the way she learned to see her parents as they parented her.  We talked about rational versus irrational fears and what each of the 2 are rooted in for the patient.  For homework ask her to look at what those fears are as they pertain to the messages that she sends to herself as a parent.  We also looked at different ways that she can parent and set boundaries in terms of helping her daughter establish independence in various areas of her life including play self-care etc.  She recognizes intellectually the importance of separating herself for self-care and is starting to feel better about that but we talked about learning to have wise mind in terms of intellectual and emotional value for self-care.  The patient does contract for safety having no thoughts of hurting herself or anyone else. Interventions: Cognitive Behavioral Therapy  Diagnosis: Generalized anxiety disorder.  Treatment plan: We will use cognitive behavioral therapy as well as elements of dialectical behavior and perseverance in her therapy to improve the patient's ability to manage anxiety symptoms and better handle stress.  We will look for causes for stress and anxiety and explore ways to lower it, resolve core conflicts that are contributing to anxiety as well as manage thoughts and worrisome thinking contributing to the feelings of anxiety.  Interventions included providing education about stress and anxiety and its symptoms and triggers, teach coping skills for  managing anxiety symptoms such as guided imagery relaxation breathing and progressive muscle relaxation.  We will also use cognitive behavioral therapy to identify and change anxiety provoking thoughts and behavior patterns as well as dialectical behavior therapy distress tolerance and mindfulness skills.  Goals is to see a 50% reduction in stress and anxiety symptoms with a target date of October 07, 2023. Progress: 35% Plan: I will meet  with the patient every 2 weeks  [name], [facility]                                               [name], [facility]               [name], [facility]               [name], [facility]               [name], [facility]

## 2023-07-21 ENCOUNTER — Encounter: Payer: Self-pay | Admitting: Behavioral Health

## 2023-07-21 ENCOUNTER — Ambulatory Visit (INDEPENDENT_AMBULATORY_CARE_PROVIDER_SITE_OTHER): Payer: 59 | Admitting: Behavioral Health

## 2023-07-21 DIAGNOSIS — F411 Generalized anxiety disorder: Secondary | ICD-10-CM | POA: Diagnosis not present

## 2023-07-21 DIAGNOSIS — F331 Major depressive disorder, recurrent, moderate: Secondary | ICD-10-CM

## 2023-07-21 NOTE — Progress Notes (Signed)
[facility] Counselor/Therapist Progress Note  Patient ID: Monique Pierce, MRN: [id number],    Date: 07/21/2023  Time Spent: 57 minutes from 901 to 9:58 AM This session was held via video teletherapy. The patient consented to the video teletherapy and was located in her home during this session. She is aware it is the responsibility of the patient to secure confidentiality on her end of the session. The provider was in a private home office for the duration of this session.      Treatment Type: Individual Therapy  Reported Symptoms: Anxiety/stress  Mental Status Exam: Appearance:  Well Groomed     Behavior: Appropriate  Motor: Normal  Speech/Language:  Normal Rate  Affect: Appropriate  Mood: normal  Thought process: normal  Thought content:   WNL  Sensory/Perceptual disturbances:   WNL  Orientation: oriented to person, place, time/date, situation, day of week, and month of year  Attention: Good  Concentration: Good  Memory: WNL  Fund of knowledge:  Good  Insight:   Good  Judgment:  Good  Impulse Control: Good   Risk Assessment: Danger to Self:  No Self-injurious Behavior: No Danger to Others: No Duty to Warn:no Physical Aggression / Violence:No  Access to Firearms a concern: No  Gang Involvement:No   Subjective: The patient and her husband are leaving tomorrow for a few days to go to [place] for a cousin's 40th birthday party.  She is looking forward to seeing family and the party but also recognizes that there could be some social anxiety.  She has spoken to another cousin about that and we talked about the importance of her setting clear boundaries for herself while she was there to minimize her anxiety.  Her daughter will be staying with her parents.  We looked more at her relationship with her parents.  She had a very honest conversation with her mother about taking care of herself.  There is a history of dementia and she knows her mother does not take  care of herself like she should so she talked about an honest conversation with her mother about diet and the importance of sleep and self-care mental and emotional care.  She acknowledges that there is a lot of denial with her mother but she feels that it is important to address that as she may be taking care of her mother long-term.  She feels that her doctor will say those things to her mother and her mother will listen also.  The patient did do the homework and recognize a lot of her anxious thoughts and her daughter irrational thoughts are connected to how she views a lot of things to the lens of depression.  She is doing a good job of working hard to overcome depression through coping skills but also is challenging a lot of depressive and anxious thoughts in relation to her upbringing and current situation.  She is beginning to see that she is doing things differently than her parents stated that she is a good mom and that she has a lot of responsibility.  She is setting good boundaries asking her husband to get her daughter to school in the morning as well as take her to dance on Saturday mornings so they have some time together but she has some time for herself.  She also is starting to encourage her daughter to take on more for herself which I validated for her.  The patient does contract for safety having no thoughts of hurting herself or  anyone else. Interventions: Cognitive Behavioral Therapy  Diagnosis: Generalized anxiety disorder.  Treatment plan: We will use cognitive behavioral therapy as well as elements of dialectical behavior and perseverance in her therapy to improve the patient's ability to manage anxiety symptoms and better handle stress.  We will look for causes for stress and anxiety and explore ways to lower it, resolve core conflicts that are contributing to anxiety as well as manage thoughts and worrisome thinking contributing to the feelings of anxiety.  Interventions included  providing education about stress and anxiety and its symptoms and triggers, teach coping skills for managing anxiety symptoms such as guided imagery relaxation breathing and progressive muscle relaxation.  We will also use cognitive behavioral therapy to identify and change anxiety provoking thoughts and behavior patterns as well as dialectical behavior therapy distress tolerance and mindfulness skills.  Goals is to see a 50% reduction in stress and anxiety symptoms with a target date of October 07, 2023. Progress: 35% Plan: I will meet with the patient every 2 weeks  [name], [facility]                                               [name], [facility]               [name], [facility]               [name], [facility]               [name], [facility]               [name], [facility]

## 2023-07-23 ENCOUNTER — Other Ambulatory Visit: Payer: Self-pay | Admitting: Family Medicine

## 2023-07-23 DIAGNOSIS — F330 Major depressive disorder, recurrent, mild: Secondary | ICD-10-CM

## 2023-08-04 ENCOUNTER — Encounter: Payer: Self-pay | Admitting: Behavioral Health

## 2023-08-04 ENCOUNTER — Ambulatory Visit (INDEPENDENT_AMBULATORY_CARE_PROVIDER_SITE_OTHER): Payer: 59 | Admitting: Behavioral Health

## 2023-08-04 DIAGNOSIS — F411 Generalized anxiety disorder: Secondary | ICD-10-CM

## 2023-08-04 NOTE — Progress Notes (Signed)
[facility] Counselor/Therapist Progress Note  Patient ID: Monique Pierce, MRN: [id number],    Date: 08/04/2023  Time Spent: 57 minutes from 901 to 9:58 AM This session was held via video teletherapy. The patient consented to the video teletherapy and was located in her home during this session. She is aware it is the responsibility of the patient to secure confidentiality on her end of the session. The provider was in a private home office for the duration of this session.      Treatment Type: Individual Therapy  Reported Symptoms: Anxiety/stress  Mental Status Exam: Appearance:  Well Groomed     Behavior: Appropriate  Motor: Normal  Speech/Language:  Normal Rate  Affect: Appropriate  Mood: normal  Thought process: normal  Thought content:   WNL  Sensory/Perceptual disturbances:   WNL  Orientation: oriented to person, place, time/date, situation, day of week, and month of year  Attention: Good  Concentration: Good  Memory: WNL  Fund of knowledge:  Good  Insight:   Good  Judgment:  Good  Impulse Control: Good   Risk Assessment: Danger to Self:  No Self-injurious Behavior: No Danger to Others: No Duty to Warn:no Physical Aggression / Violence:No  Access to Firearms a concern: No  Gang Involvement:No   Subjective: The boundaries that the patient is setting are starting to have benefits for her.  Her husband is taking her daughter to school every day as well as the dance on Saturday.  Both husband and daughter appear to be benefiting from that and the patient says is like a weight off of her shoulders.  She has devised a written schedule for her daughter in the morning which she has placed in several spots around the house and the daughter is following that.  She is looking closely what she can do to minimize her workload including having a time for planning meals, cleaning etc.  Her trip to [place] for her cousin's birthday went beautifully everyone got along  really well and she had minimal anxiety.  She described it as her best vacation ever.  She is starting to see a need for being more engaged in helping her mother but says that she has never had a great relationship with either and that her mom and dad argue all the time.  She is taking her mother to her primary care physician and I encouraged her to speak to him about inches and her memory.  She is going to do that.  She says she speaks very honestly to her parents and her mother has allowed her to help manage her finances but this 1 more thing that the patient has to take over that can create stress for her but she knows not doing that would create more stress.  We talked about setting boundaries with not arguing or trying to be a peacemaker and she said she has started just telling them she was going to hang up if they started arguing.  She feels that she is managing her work well and she is practicing good self-care with all that she can including time with friends etc.  The patient does contract for safety having no thoughts of hurting herself or anyone else. Interventions: Cognitive Behavioral Therapy  Diagnosis: Generalized anxiety disorder.  Treatment plan: We will use cognitive behavioral therapy as well as elements of dialectical behavior and perseverance in her therapy to improve the patient's ability to manage anxiety symptoms and better handle stress.  We will look for  causes for stress and anxiety and explore ways to lower it, resolve core conflicts that are contributing to anxiety as well as manage thoughts and worrisome thinking contributing to the feelings of anxiety.  Interventions included providing education about stress and anxiety and its symptoms and triggers, teach coping skills for managing anxiety symptoms such as guided imagery relaxation breathing and progressive muscle relaxation.  We will also use cognitive behavioral therapy to identify and change anxiety provoking thoughts  and behavior patterns as well as dialectical behavior therapy distress tolerance and mindfulness skills.  Goals is to see a 50% reduction in stress and anxiety symptoms with a target date of October 07, 2023. Progress: 35% Plan: I will meet with the patient every 2 weeks  [name], [facility]                                               [name], [facility]               [name], [facility]               [name], [facility]               [name], [facility]               [name], [facility]               [name], [facility]

## 2023-08-18 ENCOUNTER — Ambulatory Visit: Payer: 59 | Admitting: Behavioral Health

## 2023-08-20 ENCOUNTER — Other Ambulatory Visit: Payer: Self-pay | Admitting: Family Medicine

## 2023-08-20 DIAGNOSIS — F330 Major depressive disorder, recurrent, mild: Secondary | ICD-10-CM

## 2023-08-20 NOTE — Telephone Encounter (Signed)
Called and scheduled appt 08/30/23 at 4 with PCP for F/U for medication. Sent in refill

## 2023-08-30 ENCOUNTER — Telehealth (INDEPENDENT_AMBULATORY_CARE_PROVIDER_SITE_OTHER): Payer: 59 | Admitting: Family Medicine

## 2023-08-30 ENCOUNTER — Encounter: Payer: Self-pay | Admitting: Family Medicine

## 2023-08-30 DIAGNOSIS — F339 Major depressive disorder, recurrent, unspecified: Secondary | ICD-10-CM | POA: Diagnosis not present

## 2023-08-30 DIAGNOSIS — F411 Generalized anxiety disorder: Secondary | ICD-10-CM

## 2023-08-30 MED ORDER — BUPROPION HCL ER (XL) 150 MG PO TB24: 150.0000 mg | ORAL_TABLET | Freq: Every day | ORAL | 2 refills | Status: DC

## 2023-08-30 MED ORDER — SERTRALINE HCL 50 MG PO TABS: ORAL_TABLET | ORAL | 2 refills | Status: DC

## 2023-08-30 MED ORDER — BUSPIRONE HCL 10 MG PO TABS: 10.0000 mg | ORAL_TABLET | Freq: Two times a day (BID) | ORAL | 2 refills | Status: DC

## 2023-08-30 MED ORDER — SERTRALINE HCL 100 MG PO TABS: 100.0000 mg | ORAL_TABLET | Freq: Every day | ORAL | 2 refills | Status: DC

## 2023-08-30 NOTE — Progress Notes (Signed)
Chief Complaint  Patient presents with   Follow-up    medications    Subjective Monique Pierce presents for f/u anxiety/depression. We are interacting via web portal for an electronic face-to-face visit. I verified patient's ID using 2 identifiers. Patient agreed to proceed with visit via this method. Patient is at home, I am at office. Patient and I are present for visit.   Pt is currently being treated with Zoloft 150 mg/d, BuSpar 10 mg bid, Wellbutrin XL 150 mg/d.  Reports doing well since treatment. No thoughts of harming self or others. No self-medication with alcohol, prescription drugs or illicit drugs. Pt is following with a counselor/psychologist.  Past Medical History:  Diagnosis Date   Anxiety    Depression    GERD (gastroesophageal reflux disease)    Allergies as of 08/30/2023   No Known Allergies      Medication List        Accurate as of August 30, 2023  4:27 PM. If you have any questions, ask your nurse or doctor.          buPROPion 150 MG 24 hr tablet Commonly known as: WELLBUTRIN XL Take 1 tablet (150 mg total) by mouth daily. Needs appt   busPIRone 10 MG tablet Commonly known as: BUSPAR Take 1 tablet (10 mg total) by mouth 2 (two) times daily.   sertraline 50 MG tablet Commonly known as: ZOLOFT Take 50 mg by mouth daily with the 100 mg tab (total of 150 mg daily). What changed:  how much to take how to take this when to take this additional instructions Changed by: Sharlene Dory   sertraline 100 MG tablet Commonly known as: ZOLOFT Take 1 tablet (100 mg total) by mouth daily. Take with 50 mg tab for a total of 150 mg daily. What changed: Another medication with the same name was changed. Make sure you understand how and when to take each. Changed by: Jilda Roche Kaian Fahs   tiZANidine 4 MG tablet Commonly known as: Zanaflex Take 1 tablet (4 mg total) by mouth every 6 (six) hours as needed for muscle spasms.         Exam No conversational dyspnea Age appropriate judgment and insight Nml affect and mood  Assessment and Plan  GAD (generalized anxiety disorder) - Plan: sertraline (ZOLOFT) 50 MG tablet, sertraline (ZOLOFT) 100 MG tablet, busPIRone (BUSPAR) 10 MG tablet  Depression, recurrent (HCC) - Plan: buPROPion (WELLBUTRIN XL) 150 MG 24 hr tablet  Chronic, stable. Cont Wellbutrin XL 150 mg/d, BuSpar 10 mg bid, Zoloft 150 mg/d. Cont w counseling.  F/u in 6 mo. The patient voiced understanding and agreement to the plan.  Jilda Roche [place], DO 08/30/23 4:27 PM

## 2023-09-08 ENCOUNTER — Ambulatory Visit: Payer: 59 | Admitting: Behavioral Health

## 2023-09-29 ENCOUNTER — Encounter: Payer: Self-pay | Admitting: Behavioral Health

## 2023-09-29 ENCOUNTER — Ambulatory Visit (INDEPENDENT_AMBULATORY_CARE_PROVIDER_SITE_OTHER): Payer: 59 | Admitting: Behavioral Health

## 2023-09-29 DIAGNOSIS — F411 Generalized anxiety disorder: Secondary | ICD-10-CM | POA: Diagnosis not present

## 2023-09-29 NOTE — Progress Notes (Signed)
[facility] Counselor/Therapist Progress Note  Patient ID: ROCKY VOGELSONG, MRN: [id number],    Date: 09/29/2023  Time Spent: 59 minutes from 900 to 9:598 AM This session was held via video teletherapy. The patient consented to the video teletherapy and was located in her home during this session. She is aware it is the responsibility of the patient to secure confidentiality on her end of the session. The provider was in a private home office for the duration of this session.      Treatment Type: Individual Therapy  Reported Symptoms: Anxiety/stress  Mental Status Exam: Appearance:  Well Groomed     Behavior: Appropriate  Motor: Normal  Speech/Language:  Normal Rate  Affect: Appropriate  Mood: normal  Thought process: normal  Thought content:   WNL  Sensory/Perceptual disturbances:   WNL  Orientation: oriented to person, place, time/date, situation, day of week, and month of year  Attention: Good  Concentration: Good  Memory: WNL  Fund of knowledge:  Good  Insight:   Good  Judgment:  Good  Impulse Control: Good   Risk Assessment: Danger to Self:  No Self-injurious Behavior: No Danger to Others: No Duty to Warn:no Physical Aggression / Violence:No  Access to Firearms a concern: No  Gang Involvement:No   Subjective: The patient said there was a stretch when she thought everything is going well.  She was setting healthy boundaries.  The family was doing fairly well together.  They had a great week in her parents house recently.  Since that time she got word from a friend who has a child in her daughter's school that a child in second grade had threatened to bring a gun to school.  She and her husband talked about it and decided that the best course of action was to keep her child out of school at least the next day until he found out more information and felt that things were safer.  The patient's daughter's allergies were also acting up so she had not slept well.   The next morning she got up and her husband had taken her daughter to school when she was very upset.  She was so angry she did not address it with him and is not sure that she will because she says it is a pattern where she feels they have things settled and then he does something completely different.  I did encourage her to take some time to settle down before having a conversation with him if he chooses to.  We also looked at a situation with her daughter in which the patient was getting frustrated and could not understand why.  I encouraged her to speak directly to another adult involved in the situation but we also looked at why the patient was getting upset including the message that she was told growing up and how she may have been projecting that onto her daughter.  We looked at different ways that she could handle that.  She is aware that she is much more self-aware and mindful and is thankful for the growth in that.  She recognizes that she feels she is being a healthier mother and needs to give herself credit for that also. She feels that she is managing her work well and she is practicing good self-care with all that she can including time with friends etc.  The patient does contract for safety having no thoughts of hurting herself or anyone else. Interventions: Cognitive Behavioral Therapy  Diagnosis: Generalized anxiety  disorder.  Treatment plan: We will use cognitive behavioral therapy as well as elements of dialectical behavior and perseverance in her therapy to improve the patient's ability to manage anxiety symptoms and better handle stress.  We will look for causes for stress and anxiety and explore ways to lower it, resolve core conflicts that are contributing to anxiety as well as manage thoughts and worrisome thinking contributing to the feelings of anxiety.  Interventions included providing education about stress and anxiety and its symptoms and triggers, teach coping skills for  managing anxiety symptoms such as guided imagery relaxation breathing and progressive muscle relaxation.  We will also use cognitive behavioral therapy to identify and change anxiety provoking thoughts and behavior patterns as well as dialectical behavior therapy distress tolerance and mindfulness skills.  Goals is to see a 50% reduction in stress and anxiety symptoms with a target date of October 07, 2023. Progress: 35% Plan: I will meet with the patient every 2 weeks  [name], [facility]                                               [name], [facility]               [name], [facility]               [name], [facility]               [name], [facility]               [name], [facility] [address]               [name], [facility]               [name], [facility]

## 2023-10-09 ENCOUNTER — Encounter: Payer: Self-pay | Admitting: Emergency Medicine

## 2023-10-09 ENCOUNTER — Ambulatory Visit
Admission: EM | Admit: 2023-10-09 | Discharge: 2023-10-09 | Disposition: A | Discharge status: Home / Self Care | Payer: 59 | Attending: Family Medicine | Admitting: Family Medicine

## 2023-10-09 ENCOUNTER — Other Ambulatory Visit: Payer: Self-pay

## 2023-10-09 DIAGNOSIS — J069 Acute upper respiratory infection, unspecified: Secondary | ICD-10-CM

## 2023-10-09 DIAGNOSIS — R059 Cough, unspecified: Secondary | ICD-10-CM

## 2023-10-09 MED ORDER — BENZONATATE 200 MG PO CAPS: 200.0000 mg | ORAL_CAPSULE | Freq: Three times a day (TID) | ORAL | 0 refills | Status: AC | PRN

## 2023-10-09 MED ORDER — AZITHROMYCIN 250 MG PO TABS: 250.0000 mg | ORAL_TABLET | Freq: Every day | ORAL | 0 refills | Status: DC

## 2023-10-09 NOTE — ED Triage Notes (Signed)
Pt arrive c/o cough and sore throat for the past 3 days, denies any fever or chills.

## 2023-10-09 NOTE — Discharge Instructions (Addendum)
Advised patient to take medications as directed with food to completion.  Advised may take Tessalon capsules daily or as needed for cough.  Encouraged increase daily water intake to 64 ounces per day while taking these medications.  Advised if symptoms worsen and/or unresolved please follow-up with PCP or here for further evaluation.

## 2023-10-09 NOTE — ED Provider Notes (Signed)
[facility]    CSN: [id number] Arrival date & time: 10/09/23  1233      History   Chief Complaint Chief Complaint  Patient presents with   URI    HPI Monique Pierce is a 37 y.o. female.   HPI Pleasant 37 year old female presents with cough and sore throat for 3 days.  PMH significant for MDD, palpitations, and GAD.  Past Medical History:  Diagnosis Date   Anxiety    Depression    GERD (gastroesophageal reflux disease)     Patient Active Problem List   Diagnosis Date Noted   Depression, recurrent (HCC) 05/25/2022   MDD (major depressive disorder), recurrent episode, mild (HCC) 02/24/2021   GAD (generalized anxiety disorder) 02/21/2018   Palpitations 02/21/2018   S/P cesarean section 08/26/2015   GERD (gastroesophageal reflux disease) 07/01/2012   Odynophagia 07/01/2012    Past Surgical History:  Procedure Laterality Date   CESAREAN SECTION N/A 08/25/2015   Procedure: CESAREAN SECTION;  Surgeon: Marcelle Overlie, MD;  Location: [facility];  Service: Obstetrics;  Laterality: N/A;    OB History     Gravida  1   Para  1   Term  1   Preterm      AB      Living  1      SAB      IAB      Ectopic      Multiple  0   Live Births  1            Home Medications    Prior to Admission medications   Medication Sig Start Date End Date Taking? Authorizing Provider  azithromycin (ZITHROMAX) 250 MG tablet Take 1 tablet (250 mg total) by mouth daily. Take first 2 tablets together, then 1 every day until finished. 10/09/23  Yes Trevor Iha, FNP  benzonatate (TESSALON) 200 MG capsule Take 1 capsule (200 mg total) by mouth 3 (three) times daily as needed for up to 7 days. 10/09/23 10/16/23 Yes Trevor Iha, FNP  buPROPion (WELLBUTRIN XL) 150 MG 24 hr tablet Take 1 tablet (150 mg total) by mouth daily. Needs appt 08/30/23   Sharlene Dory, DO  busPIRone (BUSPAR) 10 MG tablet Take 1 tablet (10 mg total) by mouth 2 (two) times daily. 08/30/23    Sharlene Dory, DO  sertraline (ZOLOFT) 100 MG tablet Take 1 tablet (100 mg total) by mouth daily. Take with 50 mg tab for a total of 150 mg daily. 08/30/23   Sharlene Dory, DO  sertraline (ZOLOFT) 50 MG tablet Take 50 mg by mouth daily with the 100 mg tab (total of 150 mg daily). 08/30/23   Sharlene Dory, DO  tiZANidine (ZANAFLEX) 4 MG tablet Take 1 tablet (4 mg total) by mouth every 6 (six) hours as needed for muscle spasms. 10/27/22   Sharlene Dory, DO    Family History Family History  Problem Relation Age of Onset   Breast cancer Maternal Grandmother    Neurologic Disorder Father    Cancer Neg Hx    Heart disease Neg Hx     Social History Social History   Tobacco Use   Smoking status: Never   Smokeless tobacco: Never  Substance Use Topics   Alcohol use: No    Alcohol/week: 7.0 standard drinks of alcohol    Types: 7 Glasses of wine per week   Drug use: No     Allergies   Patient has no known allergies.  Review of Systems Review of Systems  Respiratory:  Positive for cough.   All other systems reviewed and are negative.    Physical Exam Triage Vital Signs ED Triage Vitals  Encounter Vitals Group     BP      Systolic BP Percentile      Diastolic BP Percentile      Pulse      Resp      Temp      Temp src      SpO2      Weight      Height      Head Circumference      Peak Flow      Pain Score      Pain Loc      Pain Education      Exclude from Growth Chart    No data found.  Updated Vital Signs BP 105/71 (BP Location: Right Arm)   Pulse 92   Temp 98.6 F (37 C) (Oral)   Resp 16   SpO2 97%   Physical Exam Vitals and nursing note reviewed.  Constitutional:      Appearance: Normal appearance. She is obese.  HENT:     Head: Normocephalic and atraumatic.     Right Ear: Tympanic membrane, ear canal and external ear normal.     Left Ear: Tympanic membrane, ear canal and external ear normal.     Mouth/Throat:      Mouth: Mucous membranes are moist.     Pharynx: Oropharynx is clear.  Eyes:     Extraocular Movements: Extraocular movements intact.     Conjunctiva/sclera: Conjunctivae normal.     Pupils: Pupils are equal, round, and reactive to light.  Cardiovascular:     Rate and Rhythm: Normal rate and regular rhythm.     Pulses: Normal pulses.     Heart sounds: Normal heart sounds.  Pulmonary:     Effort: Pulmonary effort is normal.     Breath sounds: Normal breath sounds. No wheezing, rhonchi or rales.     Comments: Nonproductive cough noted on exam Musculoskeletal:        General: Normal range of motion.     Cervical back: Normal range of motion and neck supple.  Skin:    General: Skin is warm and dry.  Neurological:     General: No focal deficit present.     Mental Status: She is alert and oriented to person, place, and time. Mental status is at baseline.  Psychiatric:        Mood and Affect: Mood normal.        Behavior: Behavior normal.      UC Treatments / Results  Labs (all labs ordered are listed, but only abnormal results are displayed) Labs Reviewed - No data to display  EKG   Radiology No results found.  Procedures Procedures (including critical care time)  Medications Ordered in UC Medications - No data to display  Initial Impression / Assessment and Plan / UC Course  I have reviewed the triage vital signs and the nursing notes.  Pertinent labs & imaging results that were available during my care of the patient were reviewed by me and considered in my medical decision making (see chart for details).     MDM: 1.  Upper respiratory tract infection, unspecified-Rx'd Zithromax (500 mg day 1, then 250 mg day 2-5; 2.  Cough, unspecified type-Rx Tessalon 200 mg capsules: Take 1 capsule 3 times daily, as needed for cough.  Advised patient to take medications as directed with food to completion.  Advised may take Tessalon capsules daily or as needed for cough.   Encouraged increase daily water intake to 64 ounces per day while taking these medications.  Advised if symptoms worsen and/or unresolved please follow-up with PCP or here for further evaluation.  Patient discharged home, hemodynamically stable. Final Clinical Impressions(s) / UC Diagnoses   Final diagnoses:  Upper respiratory tract infection, unspecified type  Cough, unspecified type     Discharge Instructions      Advised patient to take medications as directed with food to completion.  Advised may take Tessalon capsules daily or as needed for cough.  Encouraged increase daily water intake to 64 ounces per day while taking these medications.  Advised if symptoms worsen and/or unresolved please follow-up with PCP or here for further evaluation.     ED Prescriptions     Medication Sig Dispense Auth. Provider   azithromycin (ZITHROMAX) 250 MG tablet Take 1 tablet (250 mg total) by mouth daily. Take first 2 tablets together, then 1 every day until finished. 6 tablet Trevor Iha, FNP   benzonatate (TESSALON) 200 MG capsule Take 1 capsule (200 mg total) by mouth 3 (three) times daily as needed for up to 7 days. 40 capsule Trevor Iha, FNP      PDMP not reviewed this encounter.   Trevor Iha, FNP 10/09/23 1312

## 2023-10-25 ENCOUNTER — Encounter: Payer: Self-pay | Admitting: Family Medicine

## 2023-10-25 ENCOUNTER — Ambulatory Visit: Payer: 59 | Admitting: Family Medicine

## 2023-10-25 VITALS — BP 128/72 | HR 82 | Temp 98.0°F | Resp 16 | Ht 64.0 in | Wt 167.0 lb

## 2023-10-25 DIAGNOSIS — J392 Other diseases of pharynx: Secondary | ICD-10-CM

## 2023-10-25 NOTE — Progress Notes (Signed)
Chief Complaint  Patient presents with   Sore Throat    Sore Throat    Monique Pierce here for URI complaints.  Duration: 2 weeks  Associated symptoms:  irritated throat, feels like something is in her throat like a lump Denies: sinus congestion, sinus pain, rhinorrhea, itchy watery eyes, ear pain, ear drainage, wheezing, shortness of breath, myalgia, and fevers Treatment to date: Zn, tea Sick contacts: Yes; daughter  Past Medical History:  Diagnosis Date   Anxiety    Depression    GERD (gastroesophageal reflux disease)     Objective BP 128/72 (BP Location: Left Arm, Patient Position: Sitting, Cuff Size: Normal)   Pulse 82   Temp 98 F (36.7 C) (Oral)   Resp 16   Ht 5\' 4"  (1.626 m)   Wt 167 lb (75.8 kg)   SpO2 97%   BMI 28.67 kg/m  General: Awake, alert, appears stated age HEENT: AT, [place], ears patent b/l and TM's neg, nares patent w/o discharge, pharynx pink and without exudates, MMM Neck: No masses or asymmetry Heart: RRR Lungs: CTAB, no accessory muscle use Psych: Age appropriate judgment and insight, normal mood and affect  Throat irritation  Continue to push fluids, practice good hand hygiene, suspect liggering inflammation from recent URI, possibly reflux from Zpak? Pepcid + Xyzal for the next 7-10 d.  F/u prn. If starting to experience fevers, shaking, or shortness of breath, seek immediate care. Pt voiced understanding and agreement to the plan.  Jilda Roche [place], DO 10/25/23 8:20 AM

## 2023-10-25 NOTE — Patient Instructions (Addendum)
Consider throat lozenges, salt water gargles and an air humidifier for symptomatic care.   Pepcid/famotidine 20 mg 1-2 times daily can help with reflux symptoms.   Take your Xyzal daily.  Take the Pepcid/Xyzal for the next 7-10 days as this clears out.   Let us know if you need anything.

## 2023-10-29 ENCOUNTER — Ambulatory Visit (INDEPENDENT_AMBULATORY_CARE_PROVIDER_SITE_OTHER): Payer: 59 | Admitting: Behavioral Health

## 2023-10-29 ENCOUNTER — Encounter: Payer: Self-pay | Admitting: Behavioral Health

## 2023-10-29 DIAGNOSIS — F411 Generalized anxiety disorder: Secondary | ICD-10-CM

## 2023-10-29 NOTE — Progress Notes (Signed)
[facility] Counselor/Therapist Progress Note  Patient ID: Monique Pierce, MRN: [id number],    Date: 10/29/2023  Time Spent: 57 minutes from 900 to 9:57 AM This session was held via video teletherapy. The patient consented to the video teletherapy and was located in her home during this session. She is aware it is the responsibility of the patient to secure confidentiality on her end of the session. The provider was in a private home office for the duration of this session.      Treatment Type: Individual Therapy  Reported Symptoms: Anxiety/stress  Mental Status Exam: Appearance:  Well Groomed     Behavior: Appropriate  Motor: Normal  Speech/Language:  Normal Rate  Affect: Appropriate  Mood: normal  Thought process: normal  Thought content:   WNL  Sensory/Perceptual disturbances:   WNL  Orientation: oriented to person, place, time/date, situation, day of week, and month of year  Attention: Good  Concentration: Good  Memory: WNL  Fund of knowledge:  Good  Insight:   Good  Judgment:  Good  Impulse Control: Good   Risk Assessment: Danger to Self:  No Self-injurious Behavior: No Danger to Others: No Duty to Warn:no Physical Aggression / Violence:No  Access to Firearms a concern: No  Gang Involvement:No   Subjective: The patient expressed some concerns about she does not like how that is affecting her family.  She has addressed her very directly and assertively understands that there has to be a willingness for change.  She has offered support and encouragement.  Talked about what healthy boundaries would like and that relationship makes circumstances and what would be best for the patient.  We also looked at her relationship with her parent.  Her father has some health issues she has had to be part of that especially over the past week or so.  She can see cognitive decline and her mother and so she is having to help some of that in the 2.  She does express some  stress being in that sandwich generation between her daughter and her parents.  She is doing the best that she can in terms of self-care crocheting, spending time with friends, spending time with her daughter.  She recognizes that she was doing a much better job of setting firm boundaries and I encouraged her to continue to do that as well as self-care in a respectfully assertive way.  She feels that she is managing her work well and she is practicing good self-care with all that she can including time with friends etc.  The patient does contract for safety having no thoughts of hurting herself or anyone else. Interventions: Cognitive Behavioral Therapy  Diagnosis: Generalized anxiety disorder.  Treatment plan: We will use cognitive behavioral therapy as well as elements of dialectical behavior and perseverance in her therapy to improve the patient's ability to manage anxiety symptoms and better handle stress.  We will look for causes for stress and anxiety and explore ways to lower it, resolve core conflicts that are contributing to anxiety as well as manage thoughts and worrisome thinking contributing to the feelings of anxiety.  Interventions included providing education about stress and anxiety and its symptoms and triggers, teach coping skills for managing anxiety symptoms such as guided imagery relaxation breathing and progressive muscle relaxation.  We will also use cognitive behavioral therapy to identify and change anxiety provoking thoughts and behavior patterns as well as dialectical behavior therapy distress tolerance and mindfulness skills.  Goals is to see  a 50% reduction in stress and anxiety symptoms with a target date of October 07, 2023. Progress: 40% we will continue with goals as stated above with a new target date of April 05, 2024. Plan: I will meet with the patient every 2 weeks  [name],  [facility]                                               [name], [facility]               [name], [facility]               [name], [facility]               [name], [facility]               [name], [facility]               [name], [facility]               [name], [facility]               [name], [facility]

## 2023-11-25 ENCOUNTER — Encounter: Payer: Self-pay | Admitting: Behavioral Health

## 2023-11-25 ENCOUNTER — Encounter: Payer: 59 | Admitting: Behavioral Health

## 2023-11-25 NOTE — Progress Notes (Signed)
 [facility] Counselor/Therapist Progress Note  Patient ID: Monique Pierce, MRN: [id number],    Date: 02/18/2024  Time Spent: 57 minutes from 900 to 9:57 AM This session was held via video teletherapy. The patient consented to the video teletherapy and was located in her home during this session. She is aware it is the responsibility of the patient to secure confidentiality on her end of the session. The provider was in a private home office for the duration of this session.      Treatment Type: Individual Therapy  Reported Symptoms: Anxiety/stress  Mental Status Exam: Appearance:  Well Groomed     Behavior: Appropriate  Motor: Normal  Speech/Language:  Normal Rate  Affect: Appropriate  Mood: normal  Thought process: normal  Thought content:   WNL  Sensory/Perceptual disturbances:   WNL  Orientation: oriented to person, place, time/date, situation, day of week, and month of year  Attention: Good  Concentration: Good  Memory: WNL  Fund of knowledge:  Good  Insight:   Good  Judgment:  Good  Impulse Control: Good   Risk Assessment: Danger to Self:  No Self-injurious Behavior: No Danger to Others: No Duty to Warn:no Physical Aggression / Violence:No  Access to Firearms a concern: No  Gang Involvement:No   Subjective: The patient expressed some concerns about she does not like how that is affecting her family.  She has addressed her very directly and assertively understands that there has to be a willingness for change.  She has offered support and encouragement.  Talked about what healthy boundaries would like and that relationship makes circumstances and what would be best for the patient.  We also looked at her relationship with her parent.  Her father has some health issues she has had to be part of that especially over the past week or so.  She can see cognitive decline and her mother and so she is having to help some of that in the 2.  She does express some  stress being in that sandwich generation between her daughter and her parents.  She is doing the best that she can in terms of self-care crocheting, spending time with friends, spending time with her daughter.  She recognizes that she was doing a much better job of setting firm boundaries and I encouraged her to continue to do that as well as self-care in a respectfully assertive way.  She feels that she is managing her work well and she is practicing good self-care with all that she can including time with friends etc.  The patient does contract for safety having no thoughts of hurting herself or anyone else. Interventions: Cognitive Behavioral Therapy  Diagnosis: Generalized anxiety disorder.  Treatment plan: We will use cognitive behavioral therapy as well as elements of dialectical behavior and perseverance in her therapy to improve the patient's ability to manage anxiety symptoms and better handle stress.  We will look for causes for stress and anxiety and explore ways to lower it, resolve core conflicts that are contributing to anxiety as well as manage thoughts and worrisome thinking contributing to the feelings of anxiety.  Interventions included providing education about stress and anxiety and its symptoms and triggers, teach coping skills for managing anxiety symptoms such as guided imagery relaxation breathing and progressive muscle relaxation.  We will also use cognitive behavioral therapy to identify and change anxiety provoking thoughts and behavior patterns as well as dialectical behavior therapy distress tolerance and mindfulness skills.  Goals is to see  a 50% reduction in stress and anxiety symptoms with a target date of October 07, 2023. Progress: 40% we will continue with goals as stated above with a new target date of April 05, 2024. Plan: I will meet with the patient every 2 weeks  [name],  [facility]                                               [name], [facility]               [name], [facility]               [name], [facility]               [name], [facility]               [name], [facility]               [name], [facility]               [name], [facility]               [name], [facility]               [name], [facility] This encounter was created in error - please disregard.

## 2023-12-09 ENCOUNTER — Ambulatory Visit: Payer: 59 | Admitting: Behavioral Health

## 2023-12-29 ENCOUNTER — Ambulatory Visit: Payer: 59 | Admitting: Behavioral Health

## 2024-01-07 ENCOUNTER — Encounter: Payer: Self-pay | Admitting: Family Medicine

## 2024-02-09 ENCOUNTER — Encounter: Payer: Self-pay | Admitting: Family Medicine

## 2024-05-15 ENCOUNTER — Encounter: Payer: Self-pay | Admitting: Family Medicine

## 2024-05-21 ENCOUNTER — Other Ambulatory Visit: Payer: Self-pay | Admitting: Family Medicine

## 2024-05-21 DIAGNOSIS — F411 Generalized anxiety disorder: Secondary | ICD-10-CM

## 2024-05-22 MED ORDER — SERTRALINE HCL 50 MG PO TABS: ORAL_TABLET | ORAL | 0 refills | Status: DC

## 2024-06-15 ENCOUNTER — Other Ambulatory Visit: Payer: Self-pay | Admitting: Family Medicine

## 2024-06-15 DIAGNOSIS — F411 Generalized anxiety disorder: Secondary | ICD-10-CM

## 2024-06-18 ENCOUNTER — Other Ambulatory Visit: Payer: Self-pay | Admitting: Family Medicine

## 2024-06-18 DIAGNOSIS — F411 Generalized anxiety disorder: Secondary | ICD-10-CM

## 2024-07-02 ENCOUNTER — Other Ambulatory Visit: Payer: Self-pay | Admitting: Family Medicine

## 2024-07-02 DIAGNOSIS — F411 Generalized anxiety disorder: Secondary | ICD-10-CM

## 2024-07-14 ENCOUNTER — Encounter: Payer: Self-pay | Admitting: Family Medicine

## 2024-07-14 ENCOUNTER — Ambulatory Visit: Admitting: Family Medicine

## 2024-07-14 VITALS — BP 110/76 | HR 91 | Temp 98.0°F | Resp 16 | Ht 64.0 in | Wt 167.8 lb

## 2024-07-14 DIAGNOSIS — M7602 Gluteal tendinitis, left hip: Secondary | ICD-10-CM | POA: Diagnosis not present

## 2024-07-14 DIAGNOSIS — M25511 Pain in right shoulder: Secondary | ICD-10-CM

## 2024-07-14 DIAGNOSIS — M545 Low back pain, unspecified: Secondary | ICD-10-CM

## 2024-07-14 MED ORDER — MELOXICAM 15 MG PO TABS: 15.0000 mg | ORAL_TABLET | Freq: Every day | ORAL | 0 refills | Status: DC

## 2024-07-14 NOTE — Progress Notes (Signed)
 Musculoskeletal Exam  Patient: Monique Pierce DOB: Oct 07, 1986  DOS: 07/14/2024  SUBJECTIVE:  Chief Complaint:   Chief Complaint  Patient presents with   Shoulder Pain    Shoulder Pain    Monique Pierce is a 38 y.o.  female for evaluation and treatment of R shoulder, left hip pain.   Onset:  2 months ago. No inj or change in activity.  Location: low back/upper buttock and top of R shoulder Character:  aching, sometimes sharp Progression of issue:  is unchanged Associated symptoms: no catching/locking, bruising, redness, swelling, decreased ROM Treatment: to date has been: none.   Neurovascular symptoms: no  Past Medical History:  Diagnosis Date   Anxiety    Depression    GERD (gastroesophageal reflux disease)     Objective: VITAL SIGNS: BP 110/76 (BP Location: Left Arm, Patient Position: Sitting)   Pulse 91   Temp 98 F (36.7 C) (Oral)   Resp 16   Ht 5' 4 (1.626 m)   Wt 167 lb 12.8 oz (76.1 kg)   SpO2 97%   BMI 28.80 kg/m  Constitutional: Well formed, well developed. No acute distress. Thorax & Lungs: No accessory muscle use Musculoskeletal: R shoulder.   Normal active range of motion: yes.   Normal passive range of motion: yes Tenderness to palpation: no Deformity: no Ecchymosis: no Tests positive: Hawkins, Empty can Tests negative: Neer's, cross over, Obrien's, lift off, Speed's L hip TTP over L lumbar parasp msc and prox glute msc; neg Stinchfield, logroll, straight leg, FABER, FADIR, Ober's Neurologic: Normal sensory function. No focal deficits noted. DTR's equal and symmetric in LE's. No clonus. Gait nml.  Psychiatric: Normal mood. Age appropriate judgment and insight. Alert & oriented x 3.    Assessment:  Right shoulder pain, unspecified chronicity  Acute left-sided low back pain without sciatica  Gluteal tendinitis of left buttock  Plan: Stretches/exercises, heat, ice, Tylenol . Suspect supraspinatus and ant deltoid.  Stretches/exercises, Mobic   for 7 d then daily prn, heat, ice Tylenol . Lumbar parasp msc.  As above.  Prox glute msc.  F/u as originally scheduled. The patient voiced understanding and agreement to the plan.   Mabel [place], DO 07/14/24  2:03 PM

## 2024-07-14 NOTE — Patient Instructions (Addendum)
 Heat (pad or rice pillow in microwave) over affected area, 10-15 minutes twice daily.   Ice/cold pack over area for 10-15 min twice daily.  OK to take Tylenol  1000 mg (2 extra strength tabs) or 975 mg (3 regular strength tabs) every 6 hours as needed.  Send me a message in 1 month if no better.   Let us  know if you need anything.

## 2024-07-21 ENCOUNTER — Other Ambulatory Visit: Payer: Self-pay | Admitting: Family Medicine

## 2024-07-21 DIAGNOSIS — F411 Generalized anxiety disorder: Secondary | ICD-10-CM

## 2024-08-08 ENCOUNTER — Other Ambulatory Visit: Payer: Self-pay | Admitting: Family Medicine

## 2024-08-08 DIAGNOSIS — F411 Generalized anxiety disorder: Secondary | ICD-10-CM

## 2024-08-08 MED ORDER — SERTRALINE HCL 50 MG PO TABS: ORAL_TABLET | ORAL | 0 refills | Status: DC

## 2024-08-11 ENCOUNTER — Other Ambulatory Visit: Payer: Self-pay | Admitting: Family Medicine

## 2024-08-11 ENCOUNTER — Encounter: Payer: Self-pay | Admitting: Family Medicine

## 2024-08-11 MED ORDER — MELOXICAM 15 MG PO TABS: 15.0000 mg | ORAL_TABLET | Freq: Every day | ORAL | 0 refills | Status: DC

## 2024-08-16 ENCOUNTER — Encounter: Payer: Self-pay | Admitting: Family Medicine

## 2024-08-18 ENCOUNTER — Other Ambulatory Visit: Payer: Self-pay | Admitting: Family Medicine

## 2024-08-18 DIAGNOSIS — F339 Major depressive disorder, recurrent, unspecified: Secondary | ICD-10-CM

## 2024-09-14 ENCOUNTER — Other Ambulatory Visit: Payer: Self-pay | Admitting: Family Medicine

## 2024-11-04 ENCOUNTER — Other Ambulatory Visit: Payer: Self-pay | Admitting: Family Medicine

## 2024-11-04 DIAGNOSIS — F411 Generalized anxiety disorder: Secondary | ICD-10-CM

## 2024-11-06 MED ORDER — SERTRALINE HCL 50 MG PO TABS: ORAL_TABLET | ORAL | 0 refills | Status: AC

## 2024-12-12 ENCOUNTER — Ambulatory Visit: Admitting: Family Medicine

## 2024-12-13 ENCOUNTER — Other Ambulatory Visit: Payer: Self-pay | Admitting: Family

## 2024-12-13 DIAGNOSIS — N6312 Unspecified lump in the right breast, upper inner quadrant: Secondary | ICD-10-CM

## 2024-12-15 ENCOUNTER — Ambulatory Visit: Admitting: Family Medicine

## 2024-12-15 ENCOUNTER — Encounter: Payer: Self-pay | Admitting: Family Medicine

## 2024-12-15 VITALS — BP 118/76 | HR 100 | Temp 98.0°F | Resp 16 | Ht 64.0 in | Wt 174.8 lb

## 2024-12-15 DIAGNOSIS — G8929 Other chronic pain: Secondary | ICD-10-CM

## 2024-12-15 DIAGNOSIS — M545 Low back pain, unspecified: Secondary | ICD-10-CM

## 2024-12-15 NOTE — Patient Instructions (Addendum)

## 2024-12-15 NOTE — Progress Notes (Addendum)
 Musculoskeletal Exam  Patient: Monique Pierce DOB: [birth year]  DOS: 12/15/2024  SUBJECTIVE:  Chief Complaint:   Chief Complaint  Patient presents with   Back Pain    Back Pain    Monique Pierce is a 39 y.o.  female for evaluation and treatment of back pain.   Onset:  2 years ago.  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  Location: mid-lower back b/l Character:  dull  Progression of issue:  has worsened Associated symptoms: radiating to her L hip Denies bowel/bladder incontinence or weakness Treatment: to date has been muscle relaxer, stretches, Tylenol , heat, prescription NSAIDS.   Neurovascular symptoms: no  Past Medical History:  Diagnosis Date   Anxiety    Depression    GERD (gastroesophageal reflux disease)     Objective:  VITAL SIGNS: BP 118/76 (BP Location: Left Arm, Patient Position: Sitting)   Pulse 100   Temp 98 F (36.7 C) (Oral)   Resp 16   Ht 5' 4 (1.626 m)   Wt 174 lb 12.8 oz (79.3 kg)   SpO2 98%   BMI 30.00 kg/m  Constitutional: Well formed, well developed. No acute distress. HENT: Normocephalic, atraumatic.  Thorax & Lungs:  No accessory muscle use Musculoskeletal: low back.   Tenderness to palpation: no Deformity: no Ecchymosis: no Straight leg test: negative for Poor hamstring flexibility b/l. Neurologic: Normal sensory function. No focal deficits noted. DTR's equal and symmetric in LE's. No clonus.  5/5 strength throughout the lower extremities. Psychiatric: Normal mood. Age appropriate judgment and insight. Alert & oriented x 3.    Assessment:  Chronic right-sided low back pain without sciatica - Plan: Ambulatory referral to Physical Therapy  Plan: Chronic issue that is not controlled.  Stretches/exercises, heat, ice, Tylenol , meloxicam  15 mg daily as needed.  Refer to physical therapy for their opinion.  If she fails physical therapy, would consider MRI.  Okay to continue  Robaxin  as needed. F/u as originally scheduled. The patient voiced understanding and agreement to the plan.   Mabel Mt [place], DO 12/15/2024  11:14 AM

## 2024-12-28 ENCOUNTER — Other Ambulatory Visit: Payer: Self-pay | Admitting: Medical Genetics

## 2025-01-04 ENCOUNTER — Ambulatory Visit
Admission: RE | Admit: 2025-01-04 | Discharge: 2025-01-04 | Disposition: A | Discharge status: Home / Self Care | Source: Ambulatory Visit | Attending: Family | Admitting: Family

## 2025-01-04 ENCOUNTER — Other Ambulatory Visit: Payer: Self-pay | Admitting: Family

## 2025-01-04 DIAGNOSIS — N6312 Unspecified lump in the right breast, upper inner quadrant: Secondary | ICD-10-CM

## 2025-01-04 DIAGNOSIS — N6324 Unspecified lump in the left breast, lower inner quadrant: Secondary | ICD-10-CM

## 2025-01-05 ENCOUNTER — Ambulatory Visit: Admitting: Rehabilitative and Restorative Service Providers"

## 2025-01-05 ENCOUNTER — Encounter: Payer: Self-pay | Admitting: Rehabilitative and Restorative Service Providers"

## 2025-01-05 DIAGNOSIS — R293 Abnormal posture: Secondary | ICD-10-CM

## 2025-01-05 DIAGNOSIS — M5459 Other low back pain: Secondary | ICD-10-CM | POA: Diagnosis not present

## 2025-01-05 DIAGNOSIS — M6281 Muscle weakness (generalized): Secondary | ICD-10-CM | POA: Diagnosis not present

## 2025-01-11 ENCOUNTER — Other Ambulatory Visit

## 2025-01-11 DIAGNOSIS — Z006 Encounter for examination for normal comparison and control in clinical research program: Secondary | ICD-10-CM

## 2025-01-11 LAB — GENECONNECT MOLECULAR SCREEN

## 2025-01-18 ENCOUNTER — Encounter: Admitting: Rehabilitative and Restorative Service Providers"

## 2025-01-25 ENCOUNTER — Encounter: Admitting: Rehabilitative and Restorative Service Providers"

## 2025-02-01 ENCOUNTER — Encounter: Admitting: Rehabilitative and Restorative Service Providers"

## 2025-02-08 ENCOUNTER — Encounter: Admitting: Rehabilitative and Restorative Service Providers"

## 2025-02-15 ENCOUNTER — Encounter: Admitting: Rehabilitative and Restorative Service Providers"
